# Patient Record
Sex: Female | Born: 1970 | Race: White | Hispanic: No | Smoking: Never smoker
Health system: Southern US, Community
[De-identification: ages and names within clinical notes are randomized; demographics above are authoritative.]

## PROBLEM LIST (undated history)

## (undated) DIAGNOSIS — M25552 Pain in left hip: Secondary | ICD-10-CM

## (undated) DIAGNOSIS — E781 Pure hyperglyceridemia: Secondary | ICD-10-CM

## (undated) DIAGNOSIS — L409 Psoriasis, unspecified: Secondary | ICD-10-CM

## (undated) HISTORY — DX: Pure hyperglyceridemia: E78.1

## (undated) HISTORY — DX: Pain in left hip: M25.552

## (undated) HISTORY — DX: Psoriasis, unspecified: L40.9

---

## 2000-03-24 ENCOUNTER — Other Ambulatory Visit: Admission: RE | Admit: 2000-03-24 | Discharge: 2000-03-24 | Payer: Self-pay | Admitting: Gynecology

## 2001-08-11 ENCOUNTER — Other Ambulatory Visit: Admission: RE | Admit: 2001-08-11 | Discharge: 2001-08-11 | Payer: Self-pay | Admitting: Gynecology

## 2003-04-19 ENCOUNTER — Ambulatory Visit (HOSPITAL_COMMUNITY): Admission: RE | Admit: 2003-04-19 | Discharge: 2003-04-19 | Payer: Self-pay | Admitting: Obstetrics & Gynecology

## 2003-04-27 ENCOUNTER — Inpatient Hospital Stay (HOSPITAL_COMMUNITY): Admission: AD | Admit: 2003-04-27 | Discharge: 2003-04-29 | Payer: Self-pay | Admitting: Obstetrics

## 2004-01-31 ENCOUNTER — Other Ambulatory Visit: Admission: RE | Admit: 2004-01-31 | Discharge: 2004-01-31 | Payer: Self-pay | Admitting: Internal Medicine

## 2004-01-31 ENCOUNTER — Ambulatory Visit: Payer: Self-pay | Admitting: Internal Medicine

## 2004-02-05 ENCOUNTER — Ambulatory Visit (HOSPITAL_COMMUNITY): Admission: RE | Admit: 2004-02-05 | Discharge: 2004-02-05 | Payer: Self-pay | Admitting: Internal Medicine

## 2004-08-26 ENCOUNTER — Ambulatory Visit: Payer: Self-pay | Admitting: Internal Medicine

## 2004-08-28 ENCOUNTER — Ambulatory Visit: Payer: Self-pay | Admitting: Cardiology

## 2004-09-05 ENCOUNTER — Ambulatory Visit (HOSPITAL_COMMUNITY): Admission: RE | Admit: 2004-09-05 | Discharge: 2004-09-05 | Payer: Self-pay | Admitting: Internal Medicine

## 2004-09-12 ENCOUNTER — Ambulatory Visit: Payer: Self-pay | Admitting: Internal Medicine

## 2004-09-25 ENCOUNTER — Other Ambulatory Visit: Admission: RE | Admit: 2004-09-25 | Discharge: 2004-09-25 | Payer: Self-pay | Admitting: Obstetrics and Gynecology

## 2005-02-16 HISTORY — PX: CHOLECYSTECTOMY, LAPAROSCOPIC: SHX56

## 2005-03-12 ENCOUNTER — Emergency Department (HOSPITAL_COMMUNITY): Admission: EM | Admit: 2005-03-12 | Discharge: 2005-03-12 | Payer: Self-pay | Admitting: Emergency Medicine

## 2005-03-20 ENCOUNTER — Encounter (INDEPENDENT_AMBULATORY_CARE_PROVIDER_SITE_OTHER): Payer: Self-pay | Admitting: *Deleted

## 2005-03-20 ENCOUNTER — Ambulatory Visit (HOSPITAL_COMMUNITY): Admission: RE | Admit: 2005-03-20 | Discharge: 2005-03-21 | Payer: Self-pay | Admitting: General Surgery

## 2005-10-10 ENCOUNTER — Emergency Department (HOSPITAL_COMMUNITY): Admission: EM | Admit: 2005-10-10 | Discharge: 2005-10-10 | Payer: Self-pay | Admitting: *Deleted

## 2005-10-29 ENCOUNTER — Other Ambulatory Visit: Admission: RE | Admit: 2005-10-29 | Discharge: 2005-10-29 | Payer: Self-pay | Admitting: Family Medicine

## 2006-12-09 ENCOUNTER — Other Ambulatory Visit: Admission: RE | Admit: 2006-12-09 | Discharge: 2006-12-09 | Payer: Self-pay | Admitting: Family Medicine

## 2008-09-27 ENCOUNTER — Other Ambulatory Visit: Admission: RE | Admit: 2008-09-27 | Discharge: 2008-09-27 | Payer: Self-pay | Admitting: Family Medicine

## 2010-03-09 ENCOUNTER — Encounter: Payer: Self-pay | Admitting: Internal Medicine

## 2010-03-09 ENCOUNTER — Encounter: Payer: Self-pay | Admitting: Orthopedic Surgery

## 2010-07-04 NOTE — Op Note (Signed)
Brandy Elliott, Brandy Elliott                 ACCOUNT NO.:  [id number]   MEDICAL RECORD NO.:  [id number]          PATIENT TYPE:  AMB   LOCATION:  DAY                          FACILITY:  [facility]   PHYSICIAN:  Timothy E. Earlene Plater, M.D. DATE OF BIRTH:  [birth year]   DATE OF PROCEDURE:  03/20/2005  DATE OF DISCHARGE:                                 OPERATIVE REPORT   PREOPERATIVE DIAGNOSIS:  Cholecystolithiasis.   POSTOPERATIVE DIAGNOSIS:  Cholecystolithiasis.   PROCEDURE:  Laparoscopic cholecystectomy with intraoperative cholangiogram.   SURGEON:  Timothy E. Earlene Plater, M.D.   ASSISTANT:  Wilmon Arms. Tsuei, M.D.   ANESTHESIA:  General, supervised Bruce J. Council Mechanic, M.D.   This 40 year old female otherwise healthy has known gallstones and  symptomatic cholecystolithiasis and she has electively picked this time for  surgery. She is seen, identified and the permit signed today.   The patient was taken to the operating room, placed supine, general  endotracheal anesthesia administered. The abdomen was prepped and draped in  the usual fashion. A 0.25% Marcaine with epinephrine was used prior to each  skin incision. A horizontal infraumbilical incision made, the fascia  identified, opened vertically, the abdomen entered without complications.  Abdomen insufflated, general peritoneoscopy including the pelvis and the  cecum were unremarkable. The gallbladder did contain some filmy adhesions. A  second 10 mm trocar was placed in the mid epigastrium, two 5 mm trocars in  the right upper quadrant. The gallbladder was grasped, the fatty omental  adhesions taken down bluntly and sharply and the entire gallbladder then  visualized and inspected. At the base of the gallbladder, dissection was  begun revealing a single cystic duct and a single cystic artery. These were  carefully dissected out, a clip was placed on the gallbladder side of the  cystic duct, a small incision made, catheter placed percutaneously and  into  the cystic duct remnant. We had failure of the x-ray equipment and so we  returned to our dissection, dissected out the cystic artery, clearly  identified, triply clipped and divided. Posterior to that, a small posterior  artery was doubly clipped and divided. By then the x-ray equipment was  ready. We then proceeded with an operative cholangiogram showing rapid  filling of the biliary tree and spillage into the duodenum. Though the  entire biliary system was larger than you usually see, we saw no defects or  abnormalities and no stones. With this, the clip and catheter were removed,  the stump of the cystic duct triply clipped and cystic duct fully divided.  The artery fully divided and the gallbladder then dissected from the  gallbladder bed without incident or complication. Copious irrigation carried  out. It was clear, there was no bleeding or  complications and the bed was dry. Gallbladder removed through the  infraumbilical incision. That incision closed  with #1 Vicryl. Further inspection carried out and all of the irrigant, CO2,  instruments and trocars removed under direct vision. Each wound then  inspected and the wounds closed with 3-0 Monocryl. Steri-Strips applied,  final counts correct. She tolerated it well and was awakened  and taken to  the recovery room in good condition.      Timothy E. Earlene Plater, M.D.  Electronically Signed     TED/MEDQ  D:  03/20/2005  T:  03/20/2005  Job:  [id number]   cc:   Corwin Levins, M.D. [facility]  [address]  [place]  [place] [address]

## 2011-04-02 ENCOUNTER — Ambulatory Visit (INDEPENDENT_AMBULATORY_CARE_PROVIDER_SITE_OTHER): Payer: BC Managed Care – PPO | Admitting: Family Medicine

## 2011-04-02 ENCOUNTER — Encounter: Payer: Self-pay | Admitting: Family Medicine

## 2011-04-02 VITALS — BP 108/72 | HR 68 | Ht 67.75 in | Wt 197.0 lb

## 2011-04-02 DIAGNOSIS — F411 Generalized anxiety disorder: Secondary | ICD-10-CM

## 2011-04-02 MED ORDER — ALPRAZOLAM 0.25 MG PO TABS: 0.2500 mg | ORAL_TABLET | Freq: Three times a day (TID) | ORAL | Status: AC | PRN

## 2011-04-02 NOTE — Patient Instructions (Signed)
Return to discuss daily medications if your anxiety is worsening, or needing the xanax frequently.

## 2011-04-02 NOTE — Progress Notes (Signed)
Chief complaint:  anxiety symptoms on and off for a year, only on weekends. Works during week and benefits from the structure but on weekends has so much to do it becomes overwhelming  HPI:   Attacks before noon on weekends, described as though something is grabbing her throat, feels hot, heart racing overwhelmed, irritable, angry, snapping.  Symptoms were the worst this past weekend, where it was clearer to her that it was a panic attack. Took a friend's xanax the other day, which helped  Having trouble coping on the weekends on and off for over a year.   Divorced in 2008; in 2011 he moved out of the country and he stopped paying child support.  Had to file for bankruptcy. Got a new job in January, and financial stability may come soon, but still struggling financially right now.  She has history of significant fatigue, which peaked in October.  Was exhausted, fuzzy-minded, emotional.  Previous MD wanted to put her on antidepressants, she declined. She felt mentally strong, just that her body was dragging her down. Had labs were checked and were "normal".  She went to [facility] (does bioactive hormones).  Had additional thyroid studies done, cortisol tests.  She was prescribed prenenalone, progesterone, Armour thyroid.  She took these for 3 months, but didn't go back for repeat testing due to finances. She never really took the progesterone, but took the other 2 meds.  Didn't noticed much of a difference.  Got some "crescents" on the tongue after stopping the Armour thyroid (has also had in the past).  Fatigue overall is a little better, but fluctuates  She recently started running, hoping that will help with her stress, weight.  She has a history of pyriformis syndrome, taking tramadol as needed.  Massage helps, but can't afford it now.  Also finds that the tramadol gives her some energy, so she continues to take it for both pain and energy  History reviewed. No pertinent past medical  history.  Past Surgical History  Procedure Date  . Cholecystectomy, laparoscopic 2007    History   Social History  . Marital Status: Legally Separated    Spouse Name: N/A    Number of Children: 2  . Years of Education: N/A   Occupational History  . Engineering geologist   Social History Main Topics  . Smoking status: Never Smoker   . Smokeless tobacco: Never Used  . Alcohol Use: Yes     4 glasses of wine per month.  . Drug Use: No  . Sexually Active: Not Currently    Birth Control/ Protection: IUD     Copper IUD   Other Topics Concern  . Not on file   Social History Narrative   Lives with 2 daughters, dog and cat    Family History  Problem Relation Age of Onset  . Cancer Neg Hx     Current outpatient prescriptions:ibuprofen (ADVIL,MOTRIN) 200 MG tablet, Take 200 mg by mouth as needed., Disp: , Rfl: ;  Pregnenolone Micronized POWD, 1 capsule by Does not apply route daily. 25mg , Disp: , Rfl: ;  traMADol (ULTRAM) 50 MG tablet, Take 50 mg by mouth 3 (three) times daily., Disp: , Rfl: ;  ALPRAZolam (XANAX) 0.25 MG tablet, Take 1 tablet (0.25 mg total) by mouth 3 (three) times daily as needed for anxiety., Disp: 30 tablet, Rfl: 0  No Known Allergies  ROS:  Denies fever, weight changes, temperature intolerance, URI symptoms, chest pain, GI complaints, GU complaints, skin  rash. Denies depression, insomnia.  +hip pain/pyriformis syndrome, no other joint pains or other concerns  PHYSICAL EXAM: BP 108/72  Pulse 68  Ht 5' 7.75" (1.721 m)  Wt 197 lb (89.359 kg)  BMI 30.18 kg/m2  LMP 03/04/2011 Well developed, pleasant female in no distress HEENT:  PERRL, EOMI, conjunctiva clear,  OP clear Neck: no lymphadenopathy, thyromegaly or bruit Heart: regular rate and rhythm without murmur Lungs: clear bilaterally Abdomen: soft, nontender, no organomegaly or mass Extremities: no edema, 2+ pulse Skin: no rash Neuro: alert and oriented, normal gait, strength Psych: normal mood,  full range of affect. Normal hygiene, grooming, speech, eye contact  ASSESSMENT/PLAN: 1. Anxiety state, unspecified  ALPRAZolam (XANAX) 0.25 MG tablet   Feels strongly about not taking a daily medication.  Discussed stress reduction techniques, exercise, counseling.  To consider daily meds if increasing frequency/severity of anxiety.  Reviewed risks/side effects of xanax  F/u prn Will be signing release to get records from previous MD transferred here

## 2011-04-30 ENCOUNTER — Encounter: Payer: Self-pay | Admitting: *Deleted

## 2011-05-26 ENCOUNTER — Other Ambulatory Visit: Payer: Self-pay | Admitting: Family Medicine

## 2011-05-26 NOTE — Telephone Encounter (Signed)
Please pull chart for me to see if old records ever came (this was never rx'd by me, rx'd by her previous MD).  Pt seen in Feb, and records requested.  Please put chart on my desk and I'll address tomorrow.  Thanks.  The quantity and refills is NOT something I will agree to... (order came as #100 with 5 refills)

## 2011-05-26 NOTE — Telephone Encounter (Signed)
Waiting for Dr.Knapp

## 2011-05-26 NOTE — Telephone Encounter (Signed)
Chart is on desk 

## 2011-05-26 NOTE — Telephone Encounter (Signed)
Is this ok?

## 2011-05-27 NOTE — Telephone Encounter (Signed)
Chart reviewed--minimal info.  Shouldn't need longterm ultram use for pyriformis syndrome.  Needs OV if using frequently

## 2011-06-29 ENCOUNTER — Telehealth: Payer: Self-pay | Admitting: *Deleted

## 2011-06-29 ENCOUNTER — Other Ambulatory Visit: Payer: Self-pay | Admitting: Family Medicine

## 2011-06-29 MED ORDER — TRAMADOL HCL 50 MG PO TABS: 50.0000 mg | ORAL_TABLET | Freq: Four times a day (QID) | ORAL | Status: DC | PRN

## 2011-06-29 NOTE — Telephone Encounter (Signed)
Spoke with Dr.Knapp and she agreed to give patient #10 Ultram. Patient is going to call tomorrow once she checks her schedule and get an appt with Dr.Knapp in the next 10 days.

## 2011-06-29 NOTE — Telephone Encounter (Signed)
Is this okay to refill? 

## 2011-06-29 NOTE — Telephone Encounter (Signed)
Needs OV. Went through #30 in the last month (ie not just using infrequently at this point)

## 2011-07-01 ENCOUNTER — Encounter: Payer: Self-pay | Admitting: Family Medicine

## 2011-07-01 ENCOUNTER — Ambulatory Visit (INDEPENDENT_AMBULATORY_CARE_PROVIDER_SITE_OTHER): Payer: BC Managed Care – PPO | Admitting: Family Medicine

## 2011-07-01 VITALS — BP 110/72 | HR 80 | Ht 67.75 in | Wt 195.0 lb

## 2011-07-01 DIAGNOSIS — E559 Vitamin D deficiency, unspecified: Secondary | ICD-10-CM

## 2011-07-01 DIAGNOSIS — M545 Low back pain: Secondary | ICD-10-CM

## 2011-07-01 DIAGNOSIS — R5383 Other fatigue: Secondary | ICD-10-CM

## 2011-07-01 DIAGNOSIS — R5381 Other malaise: Secondary | ICD-10-CM

## 2011-07-01 MED ORDER — MELOXICAM 15 MG PO TABS: 15.0000 mg | ORAL_TABLET | Freq: Every day | ORAL | Status: AC

## 2011-07-01 NOTE — Patient Instructions (Signed)
Continue to wean off the tramadol, and only use as needed for severe pain. Stretching and yoga daily will help with your pain. Chiropractic care will help with your hip pain--consider intermittent care.  Stop the ibuprofen.  Instead take meloxicam once daily with food.  Try and take it every day for at least 10-14 days for it to have its full anti-inflammatory effect.    Vitamin D--try and take a vitamin (OTC) of at least 1000 IU every day.

## 2011-07-01 NOTE — Progress Notes (Signed)
Chief Complaint  Patient presents with  . Advice Only    discuss Ultram with Dr.Ibn Stief.   HPI:  At her initial visit, in 03/2011, we discussed her having a h/o pyriformis syndrome, using tramadol as needed.  She had reported that massage helped, but couldn't afford it.  She also had reported that the tramadol "gave her energy", and that she continued to take it for both pain and energy.  After that visit it became apparent to me that she takes the medication every day.  Upon her last refill request, she was asked to make appointment to discuss this medication in more detail, which is why she is here today. She reports that she does in fact use tramadol "as needed", but has chronic pain, and was needing it twice daily.  She recently was able to cut back to 1/2 tablet twice daily, and has needed to increase her use of ibuprofen since doing so. Using 400mg  of ibuprofen 2-3 times/day.  Doesn't bother stomach. She is very hesitant to take NSAIDs regularly, prefers prn.  She was seen in February with complaints of feeling overwhelmed, anxious and irritable on the weekends. She was given rx for xanax to use prn. --has only needed to take 1 xanax since then.  She was feeling very tired then, and described have had two "low points"--at last visit with me, and in 07/2010 when she saw Dr. Paulino Rily.  She reports that after her last visit here, her energy got better, but had recurrence of decreased energy a few weeks ago.  Gradually declining in energy over the last week.  Energy was a little better last night--she was able to play soccer, and tennis. She reports being in chronic pain in her hips.  Can't afford chiropractor or massages (which have helped in past). Tries to do some stretches in bed. Does some home exercises from neighbor who is PT.  Told that her muscles were weak. Admits to not doing exercises regularly.  She had seen [place] [facility] several years ago, who originally rx'd ultram.  MRI was done which was  reportedly normal (not in system for my review).  She was frustrated that it was normal, given cost of test.  Records from Dr. Paulino Rily were received--h/o vitamin D deficiency was noted in 09/2008.  Last visit with Dr. Paulino Rily was 07/2010 with fatigue, tearful, depression.  Hg, iron, TSH and cortisol levels were normal.  Vitamin D was low at 19.5, B12 243. Sed rate and CRP were normal.  Med check for tramadol 06/2009--LBP, recurrent, due to sacroileiitis--advised of potential habit-forming properties, and refilled #100 with 3 refills  Admits to taking Vitamin D only irregularly.  Past Medical History  Diagnosis Date  . Psoriasis   . Arthralgia of hip, left     and other joints, probably secondary to psoriasis, chronic  . High triglycerides   . Vitamin d deficiency 09/2008    Past Surgical History  Procedure Date  . Cholecystectomy, laparoscopic 2007    History   Social History  . Marital Status: Legally Separated    Spouse Name: N/A    Number of Children: 2  . Years of Education: N/A   Occupational History  . Engineering geologist   Social History Main Topics  . Smoking status: Never Smoker   . Smokeless tobacco: Never Used  . Alcohol Use: Yes     4 glasses of wine per month.  . Drug Use: No  . Sexually Active: Not Currently    Birth  Control/ Protection: IUD     Copper IUD   Other Topics Concern  . Not on file   Social History Narrative   Lives with 2 daughters, dog and cat    Family History  Problem Relation Age of Onset  . Cancer Neg Hx     Current outpatient prescriptions:ALPRAZolam (XANAX) 0.25 MG tablet, Take 1 tablet (0.25 mg total) by mouth 3 (three) times daily as needed for anxiety., Disp: 30 tablet, Rfl: 0;  ibuprofen (ADVIL,MOTRIN) 200 MG tablet, Take 200 mg by mouth as needed., Disp: , Rfl: ;  traMADol (ULTRAM) 50 MG tablet, Take 1 tablet (50 mg total) by mouth every 6 (six) hours as needed for pain., Disp: 10 tablet, Rfl: 0 meloxicam (MOBIC) 15 MG tablet,  Take 1 tablet (15 mg total) by mouth daily., Disp: 30 tablet, Rfl: 1  No Known Allergies  ROS:  Sometimes throat gets sore. Sometimes feels a squishy sensation on the right side of her neck.  Denies allergies, congestion, shortness of breath, fevers. See HPI.  Denies skin rash, GI complaints, GU complaints  PHYSICAL EXAM: BP 110/72  Pulse 80  Ht 5' 7.75" (1.721 m)  Wt 195 lb (88.451 kg)  BMI 29.87 kg/m2  LMP 06/30/2011 Well developed, pleasant, overweight female, in no distress Appears to be frustrated regarding her constant pain, and inability to afford the treatments that would make her better.  With her frustration, she seemed irritated, and snappy, but later was more friendly Back: no spinal or CVA tenderness.  Area of discomfort is at [facility] joint, but nontender on exam.  nontender at sciatic notches and at deep muscles in buttocks.  Very significantly limited ROM of pyriformis/ability to externally rotate hip and cross her R ankle over L knee and vice versa.  DTR's are 2+, normal strength, sensation.  ASSESSMENT/PLAN: 1. Lumbago  meloxicam (MOBIC) 15 MG tablet  2. Unspecified vitamin D deficiency    3. Other malaise and fatigue     History is consistent with SI joint dysfunction and pyriformis syndrome.  Tight pyriformis muscles.  Exercises/stretches shown. Encouraged f/u with chiro if/when she can afford, and discussed the importance of daily stretching. Recommended trial of yoga each evening.  Spent a lot of time discussing treatment of underlying condition, vs masking pain, which is all that the daily tramadol use does.  She is willing to try NSAID course.  NSAID precautions were reviewed, importance of using it daily for 10-14 days regularly reviewed.  Risks/side effects reviewed, to stop ibuprofen.  She reportedly will try and further wean off tramadol.  Potential risks of tramadol, including dependence, reviewed.  Vitamin D deficiency--never really treated, documented twice in old  records. Rather than checking labs again, she was encouraged to try and take at least 1000 IU of daily OTC Vitamin D on a regular basis.  Recheck at some point in the future. Risks of vitamin D deficiency were reviewed with patient--importance of getting calcium and vitamin D to protect her bones was discussed, in addition to the fact that vitamin D deficiency can contribute to her complaints of fatigue.  We also discussed the fact that it sounds as though there has been a component of anxiety (feeling overwhelmed), as well as possibly depression in the past.  Discussed that chronic pain can contribute to risk for depression.  Discussed that she should consider antidepressants (especially Cymbalta, given her chronic pain) if ongoing mood issues and pain in the future, not interested now.  All her questions were  answered.  Total visit time was 40 minutes, more than 1/2 spent in counseling

## 2011-09-08 ENCOUNTER — Other Ambulatory Visit: Payer: Self-pay | Admitting: Family Medicine

## 2011-09-08 DIAGNOSIS — Z1231 Encounter for screening mammogram for malignant neoplasm of breast: Secondary | ICD-10-CM

## 2011-09-29 ENCOUNTER — Ambulatory Visit: Payer: BC Managed Care – PPO

## 2011-10-21 ENCOUNTER — Ambulatory Visit: Payer: BC Managed Care – PPO

## 2011-11-02 ENCOUNTER — Telehealth: Payer: Self-pay | Admitting: Internal Medicine

## 2011-11-02 NOTE — Telephone Encounter (Signed)
Nope, we didn't discuss (I don't believe)--is better for depression than anxiety.  I'd like OV to discuss meds, side effects, risks, etc.

## 2011-11-02 NOTE — Telephone Encounter (Signed)
Pt would like to go on an antidepressant. Pt would like to know if you would prescribe something to her and then she follow-up later on. [facility] on [address]

## 2011-11-02 NOTE — Telephone Encounter (Signed)
Pt states she is scared about gaining weight and she has family members on wellbutrin and wants to know if she can try that? She feels like she discussed this at last visit but isnt certain

## 2011-11-02 NOTE — Telephone Encounter (Signed)
No--needs OV to discuss medications (many to choose from) and side effects.  We have previously discussed Cymbalta.  If she is willing to try that one (since it might also help with pain), then can give 1 week of 30mg  sample and call in rx for 60mg .  Recall that this is a brand medication, so more expensive than some of the others. We have discussed anxiety more than depression in the past, so I'd really prefer to see her in office prior to starting meds, to make sure that we are prescribing the appropriate medication. If she cannot come in, and just wants to start Cymbalta (recognizing increased cost), then make f/u appt for 4-5 weeks after starting med.

## 2011-11-03 NOTE — Telephone Encounter (Signed)
Pt can not make appt right now due to her mom being sick and her having to pick her kids up from school and having to leave work everyday early.

## 2011-11-06 ENCOUNTER — Ambulatory Visit
Admission: RE | Admit: 2011-11-06 | Discharge: 2011-11-06 | Disposition: A | Discharge status: Home / Self Care | Payer: BC Managed Care – PPO | Source: Ambulatory Visit | Attending: Family Medicine | Admitting: Family Medicine

## 2011-11-06 DIAGNOSIS — Z1231 Encounter for screening mammogram for malignant neoplasm of breast: Secondary | ICD-10-CM

## 2012-10-20 ENCOUNTER — Other Ambulatory Visit: Payer: Self-pay | Admitting: Family Medicine

## 2012-10-20 DIAGNOSIS — Z1231 Encounter for screening mammogram for malignant neoplasm of breast: Secondary | ICD-10-CM

## 2012-12-05 ENCOUNTER — Ambulatory Visit: Payer: BC Managed Care – PPO

## 2013-10-17 ENCOUNTER — Other Ambulatory Visit: Payer: Self-pay

## 2013-10-17 DIAGNOSIS — Z1231 Encounter for screening mammogram for malignant neoplasm of breast: Secondary | ICD-10-CM

## 2013-10-18 ENCOUNTER — Encounter (INDEPENDENT_AMBULATORY_CARE_PROVIDER_SITE_OTHER): Payer: Self-pay

## 2013-10-18 ENCOUNTER — Ambulatory Visit
Admission: RE | Admit: 2013-10-18 | Discharge: 2013-10-18 | Disposition: A | Discharge status: Home / Self Care | Payer: BC Managed Care – PPO | Source: Ambulatory Visit

## 2013-10-18 DIAGNOSIS — Z1231 Encounter for screening mammogram for malignant neoplasm of breast: Secondary | ICD-10-CM

## 2015-04-22 ENCOUNTER — Other Ambulatory Visit: Payer: Self-pay

## 2015-04-22 DIAGNOSIS — Z1231 Encounter for screening mammogram for malignant neoplasm of breast: Secondary | ICD-10-CM

## 2015-05-07 ENCOUNTER — Ambulatory Visit
Admission: RE | Admit: 2015-05-07 | Discharge: 2015-05-07 | Disposition: A | Discharge status: Home / Self Care | Payer: BC Managed Care – PPO | Source: Ambulatory Visit

## 2015-05-07 DIAGNOSIS — Z1231 Encounter for screening mammogram for malignant neoplasm of breast: Secondary | ICD-10-CM

## 2015-10-28 ENCOUNTER — Ambulatory Visit
Admission: RE | Admit: 2015-10-28 | Discharge: 2015-10-28 | Disposition: A | Discharge status: Home / Self Care | Payer: BC Managed Care – PPO | Source: Ambulatory Visit | Attending: Physical Medicine & Rehabilitation | Admitting: Physical Medicine & Rehabilitation

## 2015-10-28 ENCOUNTER — Encounter: Payer: Self-pay | Admitting: Physical Medicine & Rehabilitation

## 2015-10-28 ENCOUNTER — Encounter: Payer: BC Managed Care – PPO | Attending: Physical Medicine & Rehabilitation

## 2015-10-28 ENCOUNTER — Ambulatory Visit (HOSPITAL_BASED_OUTPATIENT_CLINIC_OR_DEPARTMENT_OTHER): Payer: BC Managed Care – PPO | Admitting: Physical Medicine & Rehabilitation

## 2015-10-28 VITALS — BP 107/72 | HR 73 | Resp 14

## 2015-10-28 DIAGNOSIS — G8929 Other chronic pain: Secondary | ICD-10-CM

## 2015-10-28 DIAGNOSIS — E559 Vitamin D deficiency, unspecified: Secondary | ICD-10-CM | POA: Diagnosis not present

## 2015-10-28 DIAGNOSIS — M25552 Pain in left hip: Secondary | ICD-10-CM | POA: Insufficient documentation

## 2015-10-28 DIAGNOSIS — M24559 Contracture, unspecified hip: Secondary | ICD-10-CM

## 2015-10-28 DIAGNOSIS — E781 Pure hyperglyceridemia: Secondary | ICD-10-CM | POA: Insufficient documentation

## 2015-10-28 DIAGNOSIS — M533 Sacrococcygeal disorders, not elsewhere classified: Secondary | ICD-10-CM

## 2015-10-28 DIAGNOSIS — M545 Low back pain: Secondary | ICD-10-CM

## 2015-10-28 DIAGNOSIS — L409 Psoriasis, unspecified: Secondary | ICD-10-CM | POA: Diagnosis not present

## 2015-10-28 MED ORDER — METHOCARBAMOL 500 MG PO TABS
500.0000 mg | ORAL_TABLET | Freq: Every day | ORAL | 1 refills | Status: AC
Start: 2015-10-28 — End: ?

## 2015-10-28 NOTE — Progress Notes (Addendum)
Subjective:    Patient ID: Brandy Elliott, female    DOB: 31-Mar-1970, 45 y.o.   MRN: [id number]  HPI CC:  Left hip and back pain  Since 2008, no obvious injury, last pregnancy in 2005, no back pain issues at that time.  Seen by Chiropracter, Integrative therapy,  +RF but not diagnosed with RA.   PT with manual therapy helped somewhat, some massage therapy.   Has had some financial issues limiting some follow through with therapy. No leg length discrepency No hx of Sacro- iliac injection  Pain interferes with sleep Walking or standing worse, elliptical Training is well tolerated. First MRI 2009, Report not available Some hip films reportedly done at a chiropractic office, although the patient thought it may have been just a pelvic film.  Patient would like to get a better idea what is causing her pain, as well as what type of activity. She should avoid.  She does work full time in a sedentary position, single mom. Social stressors are mainly financial  Pain Inventory Average Pain 6 Pain Right Now 3 My pain is constant, sharp, burning, dull, stabbing, tingling and aching  In the last 24 hours, has pain interfered with the following? General activity 9 Relation with others 7 Enjoyment of life 9 What TIME of day is your pain at its worst? morning, night Sleep (in general) Poor  Pain is worse with: walking, standing and some activites Pain improves with: heat/ice, therapy/exercise and medication Relief from Meds: 5  Mobility walk without assistance how many minutes can you walk? 15 ability to climb steps?  yes do you drive?  yes Do you have any goals in this area?  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  no  Neuro/Psych depression anxiety  Prior Studies new visit  Physicians involved in your care new visit   Family History  Problem Relation Age of Onset  . Cancer Neg Hx    Social  History   Social History  . Marital status: Legally Separated    Spouse name: N/A  . Number of children: 2  . Years of education: N/A   Occupational History  . Engineering geologist   Social History Main Topics  . Smoking status: Never Smoker  . Smokeless tobacco: Never Used  . Alcohol use Yes     Comment: 4 glasses of wine per month.  . Drug use: No  . Sexual activity: Not Currently    Birth control/ protection: IUD     Comment: Copper IUD   Other Topics Concern  . None   Social History Narrative   Lives with 2 daughters, dog and cat   Past Surgical History:  Procedure Laterality Date  . CHOLECYSTECTOMY, LAPAROSCOPIC  2007   Past Medical History:  Diagnosis Date  . Arthralgia of hip, left    and other joints, probably secondary to psoriasis, chronic  . High triglycerides   . Psoriasis   . Vitamin D deficiency 09/2008   BP 107/72 (BP Location: Left Arm, Patient Position: Sitting, Cuff Size: Large)   Pulse 73   Resp 14   SpO2 97%   Opioid Risk Score:   Fall Risk Score:  `1  Depression screen PHQ 2/9  Depression screen PHQ 2/9 10/28/2015  Decreased Interest 1  Down, Depressed, Hopeless 1  PHQ - 2 Score 2  Altered sleeping 1  Tired, decreased energy 3  Change in  appetite 1  Feeling bad or failure about yourself  2  Trouble concentrating 2  Moving slowly or fidgety/restless 0  Suicidal thoughts 0  PHQ-9 Score 11  Difficult doing work/chores Very difficult    Review of Systems  Constitutional: Positive for unexpected weight change.  HENT: Negative.   Eyes: Negative.   Respiratory: Negative.   Cardiovascular: Negative.   Gastrointestinal: Negative.   Endocrine: Negative.   Genitourinary: Negative.   Musculoskeletal: Positive for back pain.  Allergic/Immunologic: Negative.   Neurological: Negative.   Hematological: Negative.   Psychiatric/Behavioral: Positive for dysphoric mood. The patient is nervous/anxious.   All other systems reviewed and are  negative.      Objective:   Physical Exam  Constitutional: She is oriented to person, place, and time. She appears well-developed and well-nourished.  HENT:  Head: Normocephalic and atraumatic.  Eyes: Conjunctivae and EOM are normal. Pupils are equal, round, and reactive to light.  Neck: Normal range of motion.  Cardiovascular: Normal rate, regular rhythm and normal heart sounds.   Pulmonary/Chest: Effort normal and breath sounds normal.  Abdominal: Soft. Bowel sounds are normal. She exhibits no distension. There is no tenderness.  Musculoskeletal:       Right hip: She exhibits decreased range of motion. She exhibits normal strength, no tenderness and no deformity.       Left hip: She exhibits decreased range of motion. She exhibits normal strength, no tenderness and no deformity.       Right knee: Normal.       Left knee: Normal.       Right ankle: Normal.       Left ankle: Normal.       Cervical back: Normal.       Thoracic back: Normal.       Lumbar back: She exhibits decreased range of motion and tenderness. She exhibits no deformity and no spasm.  Patient has good lumbar range of motion with forward flexion but has limited extension to approximately 50% as well as lateral bending and rotation. He has lower back pain during these maneuvers.  Neurological: She is alert and oriented to person, place, and time.  Psychiatric: She has a normal mood and affect.  Nursing note and vitals reviewed.   0, external rotation at bilateral hips Normal internal rotation. Hip External rotation is accompanied by pain. Femoral stretch test is negative bilaterally. Sensation normal. Bilateral C5, C6-C7, C8, T1, L2, L3, L4, L5-S1 dermatomal distribution  Patient has     Assessment & Plan:  1. Chronic low back and hip pain. The main clinical finding is extremely limited hip external rotation, as well as some limited lumbar extension due to pain. Impression is that her main pathology is likely  at the hip joints. She may have some labral pathology or femoral acetabular impingement, given her range of motion limitation, mainly external rotation would favor labral pathology over FAI. Given her age, Osteoarthritis is less likely. She likely has some accompanying sacroiliac pain as well as some myofascial pain related to hip mobility issues and compensatory movements. Will check hip x-rays, if these are negative, may consider hip MRI and referral to orthopedics We'll start Robaxin 500 mg 1-2 daily at bedtime she has done well with muscle relaxants in the past to at least help her sleep  Do not think any further PT needed at this time given her recent PT trial  Do not anticipate use of narcotic analgesics in this patient, no UDS sent  Return  to clinic in 2 weeks

## 2015-11-01 ENCOUNTER — Telehealth: Payer: Self-pay | Admitting: Physical Medicine & Rehabilitation

## 2015-11-01 DIAGNOSIS — M25559 Pain in unspecified hip: Principal | ICD-10-CM

## 2015-11-01 DIAGNOSIS — M24559 Contracture, unspecified hip: Secondary | ICD-10-CM

## 2015-11-01 DIAGNOSIS — G8929 Other chronic pain: Secondary | ICD-10-CM

## 2015-11-01 NOTE — Telephone Encounter (Signed)
May need MRI of hips since xrays normal Can discuss at next visit

## 2015-11-01 NOTE — Telephone Encounter (Signed)
Good news!  Normal!

## 2015-11-01 NOTE — Telephone Encounter (Signed)
Patient would like to know the results of Skip MayerXray's that she had done.  Please call patient.

## 2015-11-01 NOTE — Telephone Encounter (Signed)
She would like to proceed with the MRI now if possible.  Her appt is 9/25 but she wants to go ahead.

## 2015-11-05 ENCOUNTER — Telehealth: Payer: Self-pay

## 2015-11-05 NOTE — Telephone Encounter (Signed)
Pt is getting an MRI done next Tuesday for hips. She was wondering if she could also get an order to look at her SI joint and lower back. She states that her last MRI of the hips 10 years ago was normal and she "would hate to come back" if it turned out normal again. Please advise.

## 2015-11-05 NOTE — Telephone Encounter (Signed)
Left message to notify the pt.

## 2015-11-05 NOTE — Telephone Encounter (Signed)
A hip MRI will image the sacroiliac joints as well

## 2015-11-06 ENCOUNTER — Telehealth: Payer: Self-pay | Admitting: Physical Medicine & Rehabilitation

## 2015-11-06 NOTE — Telephone Encounter (Signed)
Please advise 

## 2015-11-06 NOTE — Telephone Encounter (Signed)
Left message to advise pt  

## 2015-11-06 NOTE — Telephone Encounter (Signed)
Will get MRI hip and discuss at followup

## 2015-11-06 NOTE — Telephone Encounter (Signed)
Patient is returning a call to our office.  She also is having a new issue with tingling on the left side of her body mid torso through mid thigh at night.  Please call patient.

## 2015-11-11 ENCOUNTER — Ambulatory Visit: Payer: BC Managed Care – PPO | Admitting: Physical Medicine & Rehabilitation

## 2015-11-12 ENCOUNTER — Inpatient Hospital Stay: Admission: RE | Admit: 2015-11-12 | Payer: BC Managed Care – PPO | Source: Ambulatory Visit

## 2015-11-12 ENCOUNTER — Other Ambulatory Visit: Payer: BC Managed Care – PPO

## 2015-11-14 ENCOUNTER — Ambulatory Visit: Payer: BC Managed Care – PPO | Admitting: Physical Medicine & Rehabilitation

## 2015-11-14 ENCOUNTER — Ambulatory Visit: Payer: BC Managed Care – PPO

## 2015-11-20 ENCOUNTER — Ambulatory Visit
Admission: RE | Admit: 2015-11-20 | Discharge: 2015-11-20 | Disposition: A | Discharge status: Home / Self Care | Payer: BC Managed Care – PPO | Source: Ambulatory Visit | Attending: Physical Medicine & Rehabilitation | Admitting: Physical Medicine & Rehabilitation

## 2015-11-20 DIAGNOSIS — G8929 Other chronic pain: Secondary | ICD-10-CM

## 2015-11-20 DIAGNOSIS — M24559 Contracture, unspecified hip: Secondary | ICD-10-CM

## 2015-11-20 DIAGNOSIS — M25559 Pain in unspecified hip: Principal | ICD-10-CM

## 2015-11-20 MED ORDER — GADOBENATE DIMEGLUMINE 529 MG/ML IV SOLN
18.0000 mL | Freq: Once | INTRAVENOUS | Status: AC | PRN
  Administered 2015-11-20: 18 mL via INTRAVENOUS

## 2015-11-21 ENCOUNTER — Ambulatory Visit (HOSPITAL_BASED_OUTPATIENT_CLINIC_OR_DEPARTMENT_OTHER): Payer: BC Managed Care – PPO | Admitting: Physical Medicine & Rehabilitation

## 2015-11-21 ENCOUNTER — Encounter: Payer: BC Managed Care – PPO | Attending: Physical Medicine & Rehabilitation

## 2015-11-21 ENCOUNTER — Encounter: Payer: Self-pay | Admitting: Physical Medicine & Rehabilitation

## 2015-11-21 VITALS — BP 122/84 | HR 72 | Resp 14

## 2015-11-21 DIAGNOSIS — E559 Vitamin D deficiency, unspecified: Secondary | ICD-10-CM | POA: Diagnosis not present

## 2015-11-21 DIAGNOSIS — M545 Low back pain: Secondary | ICD-10-CM | POA: Insufficient documentation

## 2015-11-21 DIAGNOSIS — S73191D Other sprain of right hip, subsequent encounter: Secondary | ICD-10-CM | POA: Diagnosis not present

## 2015-11-21 DIAGNOSIS — M162 Bilateral osteoarthritis resulting from hip dysplasia: Secondary | ICD-10-CM

## 2015-11-21 DIAGNOSIS — E781 Pure hyperglyceridemia: Secondary | ICD-10-CM | POA: Diagnosis not present

## 2015-11-21 DIAGNOSIS — G8929 Other chronic pain: Secondary | ICD-10-CM | POA: Diagnosis not present

## 2015-11-21 DIAGNOSIS — M25552 Pain in left hip: Secondary | ICD-10-CM | POA: Diagnosis not present

## 2015-11-21 DIAGNOSIS — L409 Psoriasis, unspecified: Secondary | ICD-10-CM | POA: Diagnosis not present

## 2015-11-21 DIAGNOSIS — S73192D Other sprain of left hip, subsequent encounter: Secondary | ICD-10-CM

## 2015-11-21 DIAGNOSIS — S73191A Other sprain of right hip, initial encounter: Secondary | ICD-10-CM | POA: Insufficient documentation

## 2015-11-21 NOTE — Patient Instructions (Signed)
Referral made to Dr. Caswell CorwinStubbs at [facility]. Their office will contact you. If you wish to have a left hip injection under fluoroscopic guidance. Please call my office. I will see in 3 months for follow-up.

## 2015-11-21 NOTE — Progress Notes (Signed)
Subjective:    Patient ID: Brandy Elliott, female    DOB: 1970-07-20, 45 y.o.   MRN: [id number]  HPI  MRI bilateral hips showed labral tears.Please see report listed below. Patient continues to complain of hip and back pain. She states that while laying on the MRI table. Her back was hurting her. She has some pain that radiates from the left hip into the left lateral knee. She has goals of weight loss as well as increasing her level of activity. She states that as recently as 2 years ago she was running and doing other vigorous exercise. She has no numbness or tingling in the legs. She has no swelling in the knee. Patient states that she has clicking sounds in her hip in her back but not in her knee. She feels like the muscles around her hip are tight.   Pain Inventory Average Pain 6 Pain Right Now 4 My pain is sharp, burning, dull, stabbing, tingling and aching  In the last 24 hours, has pain interfered with the following? General activity 10 Relation with others 9 Enjoyment of life 10 What TIME of day is your pain at its worst? All Sleep (in general) Poor  Pain is worse with: walking, bending, sitting, standing and some activites Pain improves with: medication Relief from Meds: 5  Mobility walk without assistance how many minutes can you walk? 10 Do you have any goals in this area?  no  Function employed # of hrs/week 40 Do you have any goals in this area?  no  Neuro/Psych No problems in this area  Prior Studies Any changes since last visit?  no CLINICAL DATA:  Left hip and pelvic pain for more than 10 years, worsening.  EXAM: MRI OF THE LEFT HIP WITHOUT AND WITH CONTRAST  TECHNIQUE: Multiplanar, multisequence MR imaging was performed both before and after administration of intravenous contrast.  CONTRAST:  18 ml MULTIHANCE GADOBENATE DIMEGLUMINE 529 MG/ML IV SOLN  COMPARISON:  Plain films of the hips 07/28/2015.  FINDINGS: Bones: The acetabular  fossae appear shallow bilaterally compatible with mild acetabular dysplasia. Marrow signal is somewhat heterogeneous throughout most consistent with marrow reactivation as can be seen in obesity or cigarette smoking. There is no avascular necrosis of the femoral heads. No worrisome marrow lesion is identified.  Articular cartilage and labrum  Articular cartilage: Cartilage is thinned about both the right and left hips.  Labrum: There is extensive tearing of the anterior superior and superior acetabular labrum on the left. A paralabral cyst measuring 0.4 cm in diameter is seen at the 2 o'clock position of the anterior labrum. There is also a paralabral cyst off the right labrum at approximately the 1 o'clock position measuring 0.8 cm consistent with labral tear.  Joint or bursal effusion  Joint effusion: Small left hip joint effusion is noted. Postcontrast imaging demonstrates synovial enhancement about the left hip consistent with synovitis.  Bursae:  Unremarkable.  Muscles and tendons  Muscles and tendons:  Intact.  Other findings  Miscellaneous:   Imaged intrapelvic contents are unremarkable.  IMPRESSION: Advanced for age appearing bilateral hip osteoarthritis with associated labral tears. Shallow appearance of the acetabular fossa on the right and left is compatible with mild acetabular dysplasia.   Electronically Signed   By: Drusilla Kannerhomas  Dalessio M.D.   On: 11/20/2015 10:23 Physicians involved in your care Primary care Dr. Jillyn HiddenFulp   Family History  Problem Relation Age of Onset  . Cancer Neg Hx    Social History  Social History  . Marital status: Legally Separated    Spouse name: N/A  . Number of children: 2  . Years of education: N/A   Occupational History  . Engineering geologist   Social History Main Topics  . Smoking status: Never Smoker  . Smokeless tobacco: Never Used  . Alcohol use Yes     Comment: 4 glasses of wine per month.  .  Drug use: No  . Sexual activity: Not Currently    Birth control/ protection: IUD     Comment: Copper IUD   Other Topics Concern  . None   Social History Narrative   Lives with 2 daughters, dog and cat   Past Surgical History:  Procedure Laterality Date  . CHOLECYSTECTOMY, LAPAROSCOPIC  2007   Past Medical History:  Diagnosis Date  . Arthralgia of hip, left    and other joints, probably secondary to psoriasis, chronic  . High triglycerides   . Psoriasis   . Vitamin D deficiency 09/2008   BP 122/84   Pulse 72   Resp 14   SpO2 98%   Opioid Risk Score:   Fall Risk Score:  `1  Depression screen PHQ 2/9  Depression screen PHQ 2/9 10/28/2015  Decreased Interest 1  Down, Depressed, Hopeless 1  PHQ - 2 Score 2  Altered sleeping 1  Tired, decreased energy 3  Change in appetite 1  Feeling bad or failure about yourself  2  Trouble concentrating 2  Moving slowly or fidgety/restless 0  Suicidal thoughts 0  PHQ-9 Score 11  Difficult doing work/chores Very difficult      Review of Systems  All other systems reviewed and are negative.      Objective:   Physical Exam  Constitutional: She is oriented to person, place, and time. She appears well-developed and well-nourished.  HENT:  Head: Normocephalic and atraumatic.  Eyes: Conjunctivae and EOM are normal. Pupils are equal, round, and reactive to light.  Neck: Normal range of motion.  Musculoskeletal:       Right hip: She exhibits decreased range of motion and deformity. She exhibits normal strength and no tenderness.       Left hip: She exhibits decreased range of motion and deformity. She exhibits normal strength and no tenderness.       Lumbar back: She exhibits decreased range of motion and tenderness. She exhibits no bony tenderness and no deformity.  Neurological: She is alert and oriented to person, place, and time. She displays no atrophy. No sensory deficit. Gait normal.  Negative straight leg raising  test Motor strength is 5/5 bilateral hip flexor, knee extensor, ankle dorsiflexor and plantar flexor  Sensation intact to light touch and pinprick at bilateral L3, L4, L5-S1 dermatomal distribution  Skin: Skin is warm and dry.  Psychiatric: She has a normal mood and affect. Her behavior is normal. Judgment and thought content normal.  Nursing note and vitals reviewed.  Patient has bilateral hip flexion contractures of approximately 20 she does have pain with Maisie Fus test         Assessment & Plan:  1. Bilateral hip labral tears with osteoarthritis. In a patient with mild hip acetabular dysplasia, shallow acetabulum. We discussed that this is her primary pain diagnosis rather than her low back. We also discussed the hip contractures bilaterally  We discussed treatment options including physical therapy, home exercise program to include primarily low impact activities such as stationary bicycling. Some stretching activities to address hip flexor contracture. We also discussed  intra-articular cortical steroid injection to help with pain relief. We discussed orthopedic referral to Orthopedics for arthroscopic labral repair versus other interventions. She would like to pursue this option as well as continue and adjust home exercise program.

## 2015-11-28 ENCOUNTER — Telehealth: Payer: Self-pay | Admitting: Physical Medicine & Rehabilitation

## 2015-11-28 DIAGNOSIS — M25559 Pain in unspecified hip: Secondary | ICD-10-CM

## 2015-11-28 DIAGNOSIS — M162 Bilateral osteoarthritis resulting from hip dysplasia: Secondary | ICD-10-CM

## 2015-11-28 DIAGNOSIS — G8929 Other chronic pain: Secondary | ICD-10-CM

## 2015-11-28 NOTE — Telephone Encounter (Signed)
Patient called wanting to know if she could have an order for Physical [place] an [place] for treatment before meeting with [facility].

## 2015-11-29 NOTE — Telephone Encounter (Signed)
May order physical therapy with diagnosis of chronic hip pain, [place] [facility]

## 2015-11-29 NOTE — Telephone Encounter (Signed)
Order placed

## 2016-04-22 ENCOUNTER — Other Ambulatory Visit: Payer: Self-pay | Admitting: Orthopedic Surgery

## 2017-12-07 ENCOUNTER — Other Ambulatory Visit: Payer: Self-pay | Admitting: Physician Assistant

## 2017-12-07 DIAGNOSIS — Z1231 Encounter for screening mammogram for malignant neoplasm of breast: Secondary | ICD-10-CM

## 2018-01-20 ENCOUNTER — Ambulatory Visit
Admission: RE | Admit: 2018-01-20 | Discharge: 2018-01-20 | Disposition: A | Discharge status: Home / Self Care | Payer: BC Managed Care – PPO | Source: Ambulatory Visit | Attending: Physician Assistant | Admitting: Physician Assistant

## 2018-01-20 DIAGNOSIS — Z1231 Encounter for screening mammogram for malignant neoplasm of breast: Secondary | ICD-10-CM

## 2019-03-10 ENCOUNTER — Other Ambulatory Visit: Payer: Self-pay | Admitting: Physician Assistant

## 2019-03-10 DIAGNOSIS — Z1231 Encounter for screening mammogram for malignant neoplasm of breast: Secondary | ICD-10-CM

## 2019-03-14 ENCOUNTER — Other Ambulatory Visit: Payer: Self-pay

## 2019-03-14 ENCOUNTER — Ambulatory Visit
Admission: RE | Admit: 2019-03-14 | Discharge: 2019-03-14 | Disposition: A | Discharge status: Home / Self Care | Payer: BC Managed Care – PPO | Source: Ambulatory Visit

## 2019-03-14 DIAGNOSIS — Z1231 Encounter for screening mammogram for malignant neoplasm of breast: Secondary | ICD-10-CM

## 2020-08-08 ENCOUNTER — Other Ambulatory Visit: Payer: Self-pay | Admitting: Gastroenterology

## 2020-08-08 DIAGNOSIS — R1013 Epigastric pain: Secondary | ICD-10-CM

## 2020-08-08 DIAGNOSIS — R1310 Dysphagia, unspecified: Secondary | ICD-10-CM

## 2020-08-20 ENCOUNTER — Other Ambulatory Visit: Payer: BC Managed Care – PPO

## 2020-08-28 ENCOUNTER — Other Ambulatory Visit: Payer: BC Managed Care – PPO

## 2021-04-14 ENCOUNTER — Other Ambulatory Visit (HOSPITAL_COMMUNITY): Payer: Self-pay

## 2021-04-14 MED ORDER — AMPHETAMINE-DEXTROAMPHETAMINE 20 MG PO TABS
20.0000 mg | ORAL_TABLET | Freq: Two times a day (BID) | ORAL | 0 refills | Status: DC
  Filled 2021-05-22: qty 60, 30d supply, fill #0

## 2021-04-14 MED ORDER — AMPHETAMINE-DEXTROAMPHETAMINE 20 MG PO TABS
20.0000 mg | ORAL_TABLET | Freq: Two times a day (BID) | ORAL | 0 refills | Status: AC
  Filled 2021-04-14: qty 60, 30d supply, fill #0

## 2021-04-15 ENCOUNTER — Other Ambulatory Visit (HOSPITAL_COMMUNITY): Payer: Self-pay

## 2021-05-22 ENCOUNTER — Other Ambulatory Visit (HOSPITAL_COMMUNITY): Payer: Self-pay

## 2021-05-23 ENCOUNTER — Other Ambulatory Visit (HOSPITAL_COMMUNITY): Payer: Self-pay

## 2021-05-26 ENCOUNTER — Other Ambulatory Visit (HOSPITAL_COMMUNITY): Payer: Self-pay

## 2021-06-26 ENCOUNTER — Other Ambulatory Visit (HOSPITAL_COMMUNITY): Payer: Self-pay

## 2021-06-26 MED ORDER — AMPHETAMINE-DEXTROAMPHETAMINE 20 MG PO TABS
ORAL_TABLET | ORAL | 0 refills | Status: DC
  Filled 2021-06-26: qty 60, 30d supply, fill #0

## 2021-08-11 ENCOUNTER — Other Ambulatory Visit (HOSPITAL_COMMUNITY): Payer: Self-pay

## 2021-08-12 ENCOUNTER — Other Ambulatory Visit (HOSPITAL_COMMUNITY): Payer: Self-pay

## 2021-08-12 MED ORDER — AMPHETAMINE-DEXTROAMPHETAMINE 20 MG PO TABS
ORAL_TABLET | ORAL | 0 refills | Status: DC
  Filled 2021-08-12: qty 4, 2d supply, fill #0

## 2021-08-15 ENCOUNTER — Other Ambulatory Visit (HOSPITAL_COMMUNITY): Payer: Self-pay

## 2021-08-15 MED ORDER — AMPHETAMINE-DEXTROAMPHETAMINE 20 MG PO TABS
ORAL_TABLET | ORAL | 0 refills | Status: AC
  Filled 2021-08-15 – 2021-09-24 (×2): qty 60, 30d supply, fill #0

## 2021-08-15 MED ORDER — AMPHETAMINE-DEXTROAMPHETAMINE 20 MG PO TABS
20.0000 mg | ORAL_TABLET | Freq: Two times a day (BID) | ORAL | 0 refills | Status: DC
  Filled 2021-10-30: qty 60, 30d supply, fill #0

## 2021-08-15 MED ORDER — AMPHETAMINE-DEXTROAMPHETAMINE 20 MG PO TABS
ORAL_TABLET | ORAL | 0 refills | Status: AC
  Filled 2021-08-15: qty 60, 30d supply, fill #0

## 2021-09-18 IMAGING — MG DIGITAL SCREENING BILAT W/ TOMO W/ CAD
8 series · 8 of 24 positions shown · non-contrast
Comparison: Previous exam(s).

CLINICAL DATA: Screening.

EXAM:
DIGITAL SCREENING BILATERAL MAMMOGRAM WITH TOMO AND CAD

[R CC synth-2D]
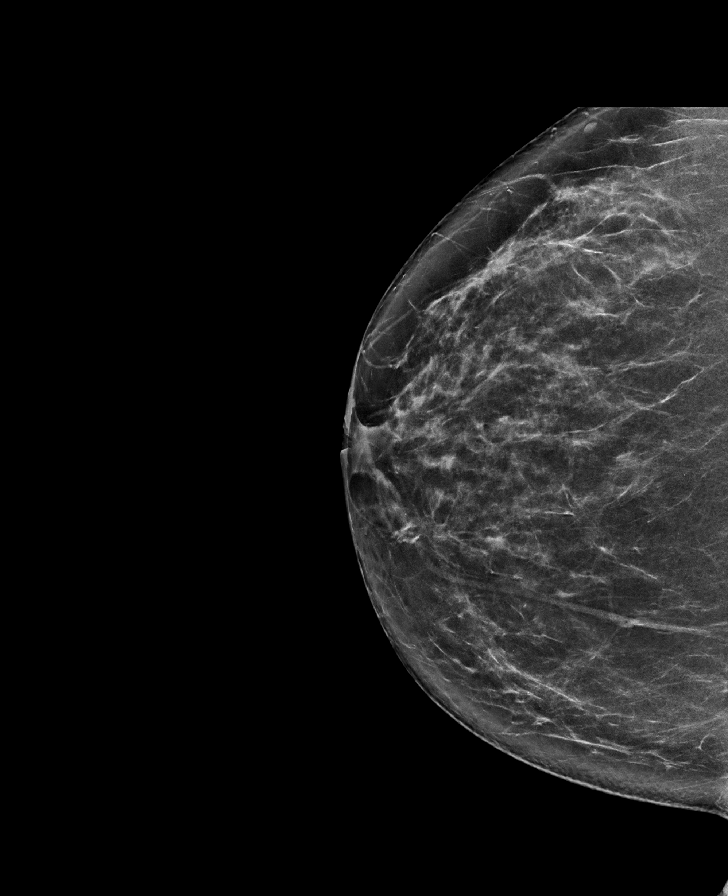

[L CC synth-2D]
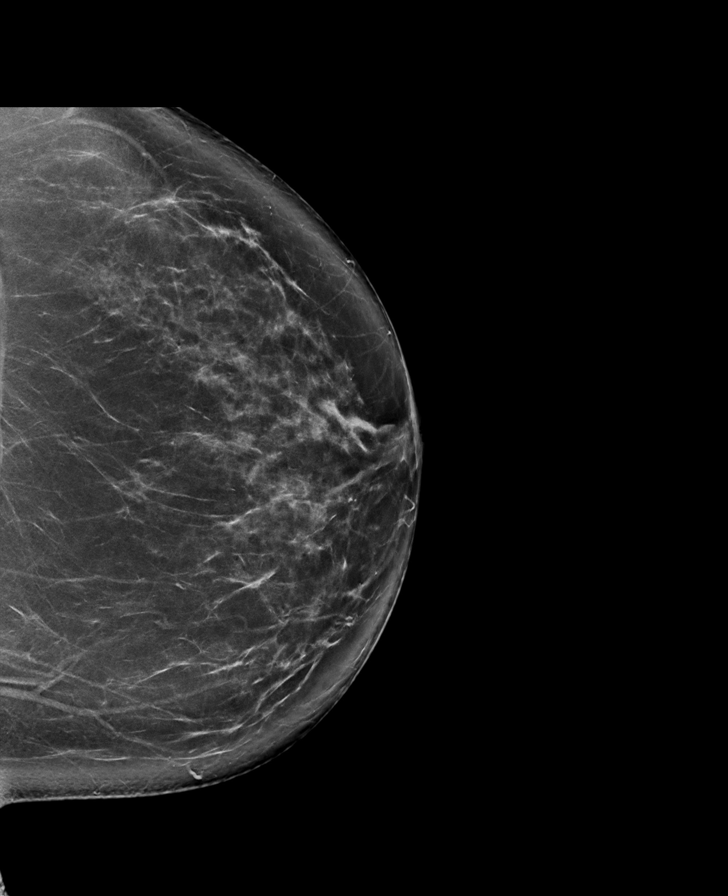

[R MLO synth-2D]
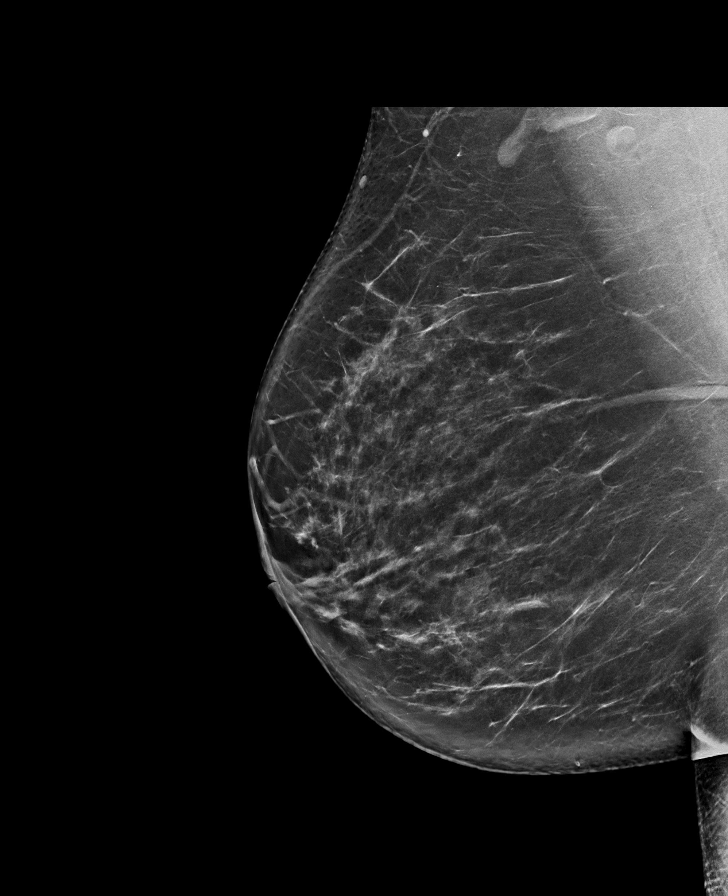

[L MLO synth-2D]
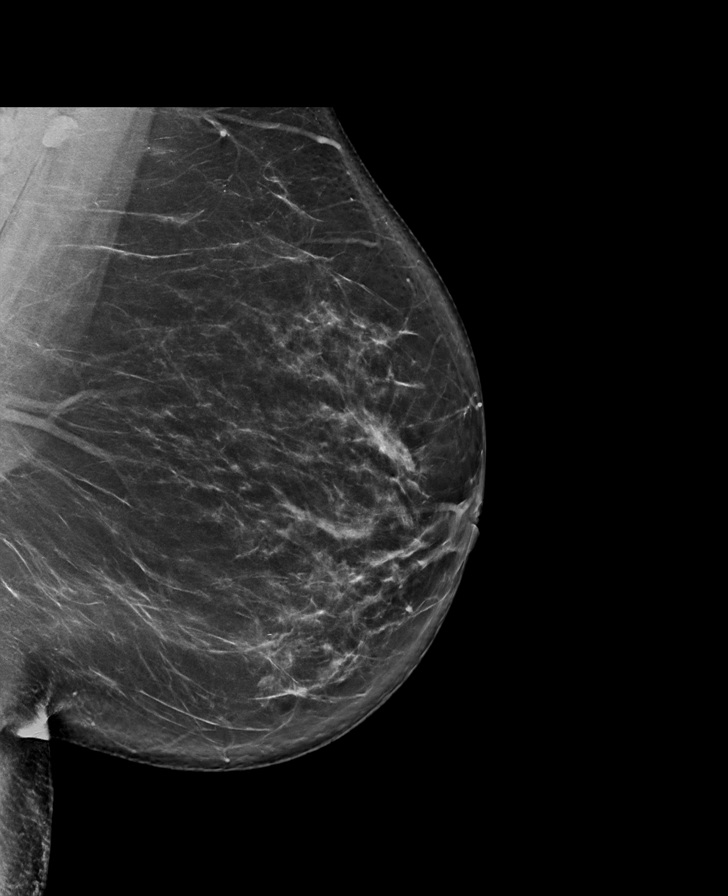

[L CC tomo · tomo slice 47/93.0]
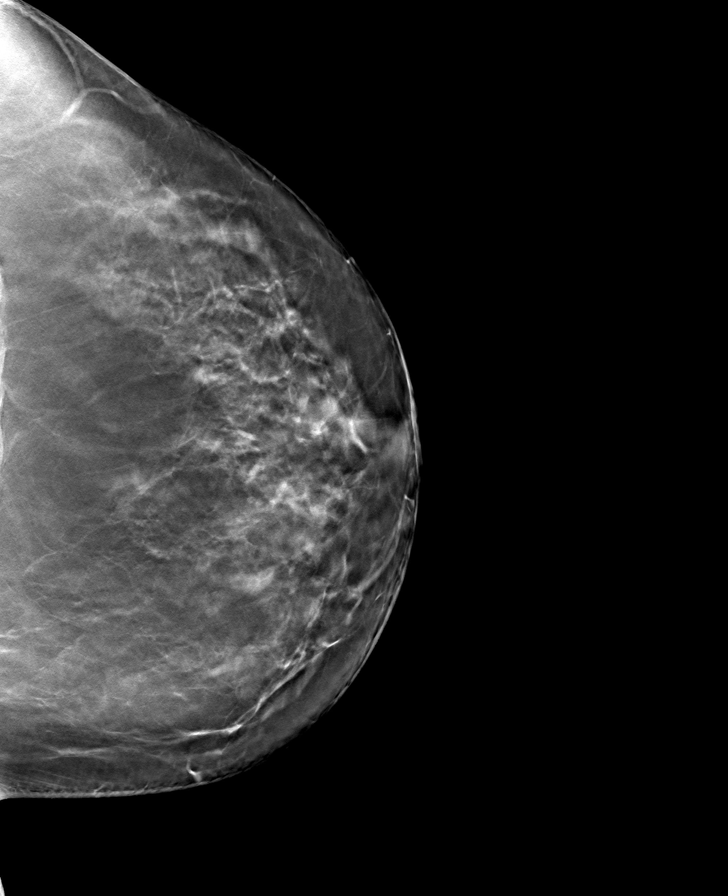

[L MLO tomo · tomo slice 48/95.0]
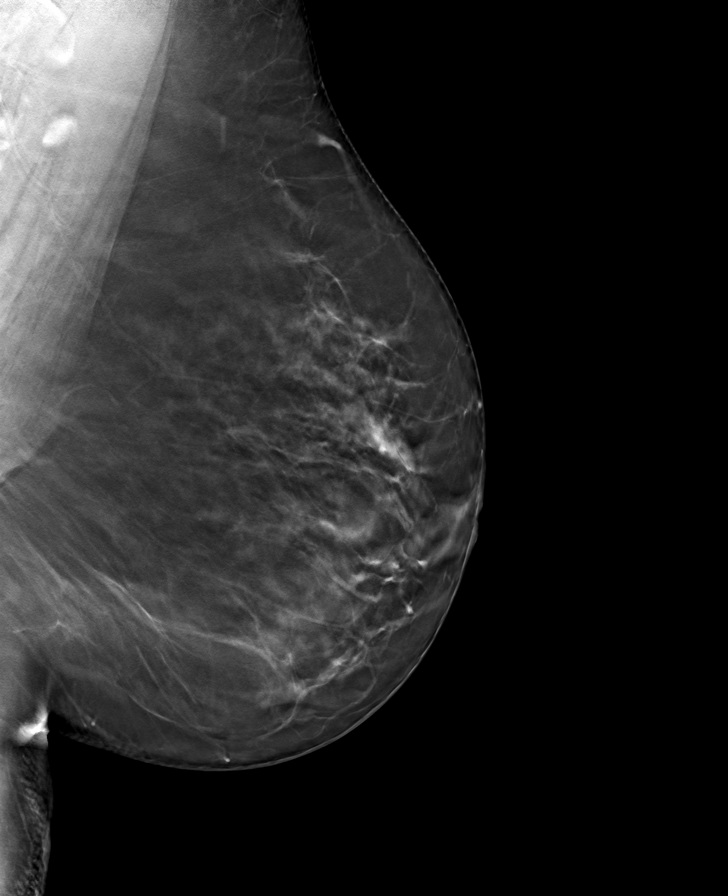

[R CC tomo · tomo slice 41/82.0]
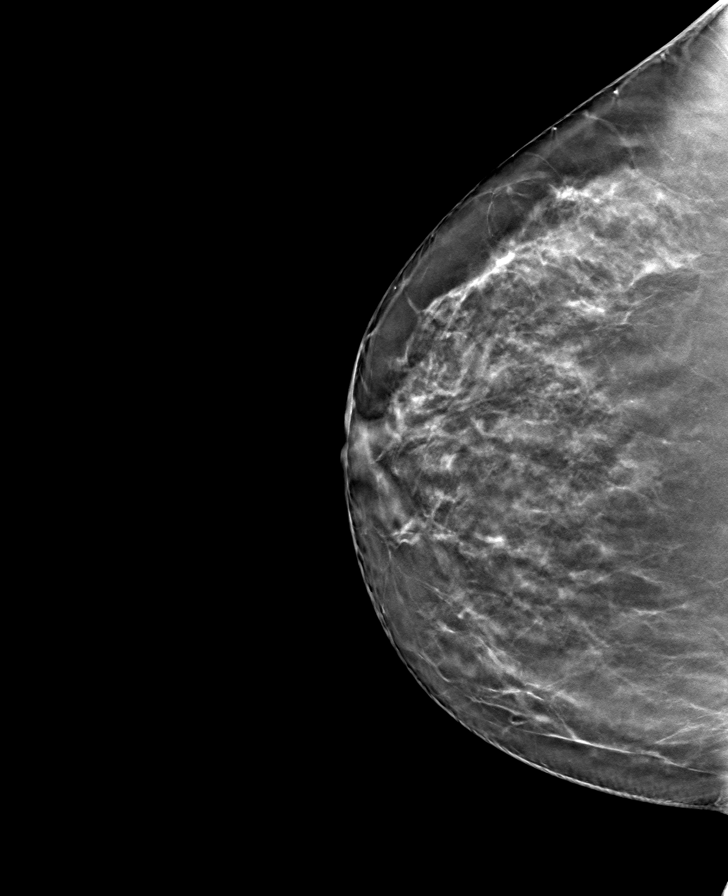

[R MLO tomo · tomo slice 47/94.0]
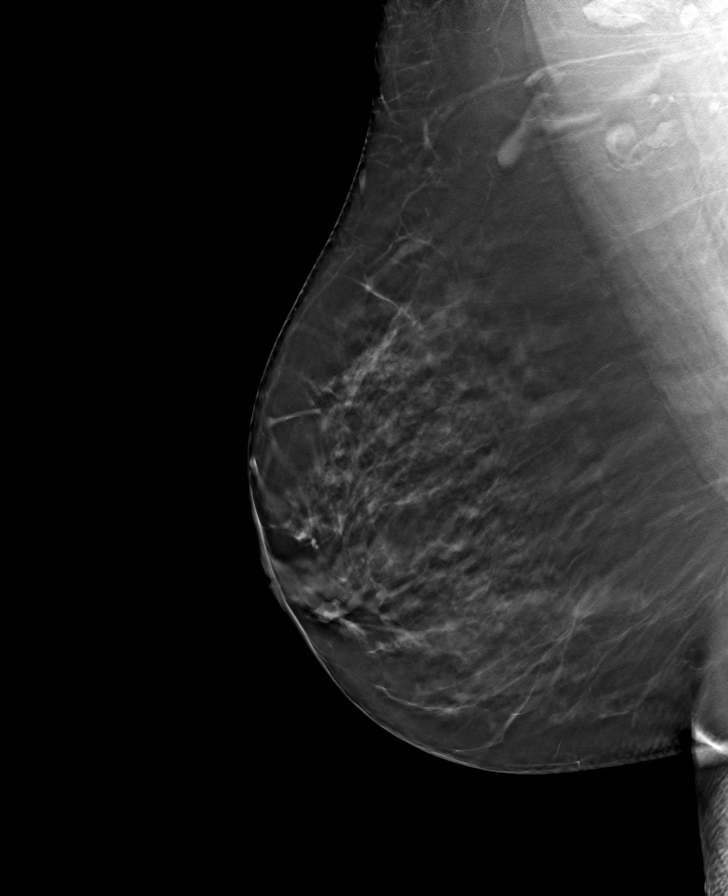

[8 of 24 positions shown; findings below may reference images not displayed]

ACR Breast Density Category b: There are scattered areas of
fibroglandular density.
FINDINGS: There are no findings suspicious for malignancy. Images were
processed with CAD.
IMPRESSION: No mammographic evidence of malignancy. A result letter of this
screening mammogram will be mailed directly to the patient.

RECOMMENDATION:
Screening mammogram in one year. (Code:[id number])

BI-RADS CATEGORY  1: Negative.

## 2021-09-24 ENCOUNTER — Other Ambulatory Visit (HOSPITAL_COMMUNITY): Payer: Self-pay

## 2021-10-28 ENCOUNTER — Other Ambulatory Visit (HOSPITAL_COMMUNITY): Payer: Self-pay

## 2021-10-28 MED ORDER — OZEMPIC (0.25 OR 0.5 MG/DOSE) 2 MG/3ML ~~LOC~~ SOPN
PEN_INJECTOR | SUBCUTANEOUS | 0 refills | Status: DC
  Filled 2021-10-28: qty 3, 28d supply, fill #0

## 2021-10-30 ENCOUNTER — Other Ambulatory Visit (HOSPITAL_COMMUNITY): Payer: Self-pay

## 2021-10-31 ENCOUNTER — Other Ambulatory Visit (HOSPITAL_COMMUNITY): Payer: Self-pay

## 2021-11-04 ENCOUNTER — Other Ambulatory Visit (HOSPITAL_COMMUNITY): Payer: Self-pay

## 2021-11-11 ENCOUNTER — Other Ambulatory Visit (HOSPITAL_COMMUNITY): Payer: Self-pay

## 2021-11-11 MED ORDER — WEGOVY 0.25 MG/0.5ML ~~LOC~~ SOAJ
0.2500 mg | SUBCUTANEOUS | 0 refills | Status: AC
  Filled 2021-11-11: qty 2, 30d supply, fill #0
  Filled 2022-03-26: qty 2, 28d supply, fill #0

## 2021-11-12 ENCOUNTER — Other Ambulatory Visit (HOSPITAL_COMMUNITY): Payer: Self-pay

## 2021-11-12 MED ORDER — SAXENDA 18 MG/3ML ~~LOC~~ SOPN
3.0000 mg | PEN_INJECTOR | SUBCUTANEOUS | 0 refills | Status: DC
  Filled 2021-11-12: qty 15, 38d supply, fill #0

## 2021-11-13 ENCOUNTER — Other Ambulatory Visit (HOSPITAL_COMMUNITY): Payer: Self-pay

## 2021-11-20 ENCOUNTER — Other Ambulatory Visit (HOSPITAL_COMMUNITY): Payer: Self-pay

## 2021-11-25 ENCOUNTER — Other Ambulatory Visit (HOSPITAL_COMMUNITY): Payer: Self-pay

## 2021-11-25 MED ORDER — DULOXETINE HCL 30 MG PO CPEP
30.0000 mg | ORAL_CAPSULE | Freq: Every day | ORAL | 1 refills | Status: AC
  Filled 2021-11-25 – 2021-12-29 (×2): qty 30, 30d supply, fill #0
  Filled 2022-05-04: qty 30, 30d supply, fill #1

## 2021-11-25 MED ORDER — DULOXETINE HCL 30 MG PO CPEP
30.0000 mg | ORAL_CAPSULE | Freq: Every day | ORAL | 1 refills | Status: DC
  Filled 2021-11-25: qty 30, 30d supply, fill #0
  Filled 2022-01-26: qty 30, 30d supply, fill #1

## 2021-11-28 ENCOUNTER — Other Ambulatory Visit (HOSPITAL_COMMUNITY): Payer: Self-pay

## 2021-12-09 ENCOUNTER — Other Ambulatory Visit (HOSPITAL_COMMUNITY): Payer: Self-pay

## 2021-12-09 MED ORDER — AMPHETAMINE-DEXTROAMPHETAMINE 20 MG PO TABS
20.0000 mg | ORAL_TABLET | Freq: Two times a day (BID) | ORAL | 0 refills | Status: DC
  Filled 2021-12-09: qty 60, 30d supply, fill #0

## 2021-12-12 ENCOUNTER — Other Ambulatory Visit (HOSPITAL_COMMUNITY): Payer: Self-pay

## 2021-12-23 ENCOUNTER — Other Ambulatory Visit (HOSPITAL_COMMUNITY): Payer: Self-pay

## 2021-12-23 MED ORDER — SAXENDA 18 MG/3ML ~~LOC~~ SOPN
3.0000 mg | PEN_INJECTOR | Freq: Every day | SUBCUTANEOUS | 0 refills | Status: DC
  Filled 2021-12-23: qty 15, 30d supply, fill #0

## 2021-12-24 ENCOUNTER — Other Ambulatory Visit (HOSPITAL_COMMUNITY): Payer: Self-pay

## 2021-12-29 ENCOUNTER — Other Ambulatory Visit (HOSPITAL_COMMUNITY): Payer: Self-pay

## 2021-12-30 ENCOUNTER — Other Ambulatory Visit (HOSPITAL_COMMUNITY): Payer: Self-pay

## 2022-01-15 ENCOUNTER — Other Ambulatory Visit (HOSPITAL_COMMUNITY): Payer: Self-pay

## 2022-01-15 MED ORDER — AMPHETAMINE-DEXTROAMPHETAMINE 20 MG PO TABS
20.0000 mg | ORAL_TABLET | Freq: Two times a day (BID) | ORAL | 0 refills | Status: DC
  Filled 2022-01-15: qty 60, 30d supply, fill #0

## 2022-01-26 ENCOUNTER — Other Ambulatory Visit (HOSPITAL_COMMUNITY): Payer: Self-pay

## 2022-01-26 MED ORDER — SAXENDA 18 MG/3ML ~~LOC~~ SOPN
PEN_INJECTOR | SUBCUTANEOUS | 0 refills | Status: DC
  Filled 2022-01-26: qty 15, 30d supply, fill #0

## 2022-02-24 ENCOUNTER — Other Ambulatory Visit (HOSPITAL_COMMUNITY): Payer: Self-pay

## 2022-02-24 MED ORDER — AMPHETAMINE-DEXTROAMPHETAMINE 20 MG PO TABS
20.0000 mg | ORAL_TABLET | Freq: Two times a day (BID) | ORAL | 0 refills | Status: DC
  Filled 2022-02-24: qty 60, 30d supply, fill #0

## 2022-02-24 MED ORDER — SAXENDA 18 MG/3ML ~~LOC~~ SOPN
3.0000 mg | PEN_INJECTOR | Freq: Every day | SUBCUTANEOUS | 0 refills | Status: DC
  Filled 2022-02-24: qty 15, 30d supply, fill #0

## 2022-02-25 ENCOUNTER — Other Ambulatory Visit (HOSPITAL_COMMUNITY): Payer: Self-pay

## 2022-02-26 ENCOUNTER — Other Ambulatory Visit (HOSPITAL_COMMUNITY): Payer: Self-pay

## 2022-02-26 MED ORDER — DULOXETINE HCL 30 MG PO CPEP
30.0000 mg | ORAL_CAPSULE | Freq: Every day | ORAL | 1 refills | Status: DC
  Filled 2022-02-26: qty 30, 30d supply, fill #0
  Filled 2022-03-26 (×2): qty 30, 30d supply, fill #1

## 2022-03-26 ENCOUNTER — Other Ambulatory Visit (HOSPITAL_COMMUNITY): Payer: Self-pay

## 2022-03-26 ENCOUNTER — Other Ambulatory Visit: Payer: Self-pay

## 2022-03-26 MED ORDER — SAXENDA 18 MG/3ML ~~LOC~~ SOPN
3.0000 mg | PEN_INJECTOR | Freq: Every day | SUBCUTANEOUS | 0 refills | Status: AC
  Filled 2022-03-26 (×2): qty 15, 30d supply, fill #0

## 2022-03-26 MED ORDER — AMPHETAMINE-DEXTROAMPHETAMINE 20 MG PO TABS
20.0000 mg | ORAL_TABLET | Freq: Two times a day (BID) | ORAL | 0 refills | Status: DC
  Filled 2022-03-26: qty 60, 30d supply, fill #0

## 2022-03-27 ENCOUNTER — Other Ambulatory Visit (HOSPITAL_COMMUNITY): Payer: Self-pay

## 2022-04-16 ENCOUNTER — Other Ambulatory Visit: Payer: Self-pay

## 2022-05-04 ENCOUNTER — Other Ambulatory Visit (HOSPITAL_COMMUNITY): Payer: Self-pay

## 2022-05-04 ENCOUNTER — Other Ambulatory Visit: Payer: Self-pay

## 2022-05-04 MED ORDER — AMPHETAMINE-DEXTROAMPHETAMINE 20 MG PO TABS
20.0000 mg | ORAL_TABLET | Freq: Two times a day (BID) | ORAL | 0 refills | Status: DC
  Filled 2022-05-04: qty 60, 30d supply, fill #0

## 2022-05-25 ENCOUNTER — Other Ambulatory Visit (HOSPITAL_COMMUNITY): Payer: Self-pay

## 2022-06-04 ENCOUNTER — Other Ambulatory Visit (HOSPITAL_COMMUNITY): Payer: Self-pay

## 2022-06-04 MED ORDER — PHENTERMINE HCL 37.5 MG PO TABS
18.7500 mg | ORAL_TABLET | Freq: Every day | ORAL | 0 refills | Status: DC
  Filled 2022-06-04: qty 30, 30d supply, fill #0

## 2022-06-05 ENCOUNTER — Other Ambulatory Visit (HOSPITAL_COMMUNITY): Payer: Self-pay

## 2022-06-08 ENCOUNTER — Other Ambulatory Visit (HOSPITAL_COMMUNITY): Payer: Self-pay

## 2022-06-08 MED ORDER — AMPHETAMINE-DEXTROAMPHETAMINE 20 MG PO TABS
20.0000 mg | ORAL_TABLET | Freq: Two times a day (BID) | ORAL | 0 refills | Status: DC
  Filled 2022-06-08: qty 60, 30d supply, fill #0

## 2022-06-10 ENCOUNTER — Other Ambulatory Visit (HOSPITAL_COMMUNITY): Payer: Self-pay

## 2022-06-10 MED ORDER — DULOXETINE HCL 30 MG PO CPEP
30.0000 mg | ORAL_CAPSULE | Freq: Every day | ORAL | 1 refills | Status: DC
  Filled 2022-06-10: qty 30, 30d supply, fill #0
  Filled 2022-07-16: qty 30, 30d supply, fill #1

## 2022-06-16 ENCOUNTER — Other Ambulatory Visit (HOSPITAL_COMMUNITY): Payer: Self-pay

## 2022-06-16 MED ORDER — CLOBETASOL PROPIONATE 0.05 % EX SOLN
CUTANEOUS | 4 refills | Status: AC
  Filled 2022-06-16: qty 50, 30d supply, fill #0
  Filled 2022-08-27 (×2): qty 50, 30d supply, fill #1
  Filled 2022-10-06: qty 50, 30d supply, fill #2
  Filled 2023-01-08: qty 50, 30d supply, fill #3
  Filled 2023-06-07: qty 50, 30d supply, fill #4

## 2022-06-17 ENCOUNTER — Other Ambulatory Visit (HOSPITAL_COMMUNITY): Payer: Self-pay

## 2022-07-02 ENCOUNTER — Other Ambulatory Visit (HOSPITAL_COMMUNITY): Payer: Self-pay

## 2022-07-07 ENCOUNTER — Other Ambulatory Visit (HOSPITAL_COMMUNITY): Payer: Self-pay

## 2022-07-07 MED ORDER — PHENTERMINE HCL 37.5 MG PO TABS
18.7500 mg | ORAL_TABLET | Freq: Every day | ORAL | 0 refills | Status: DC
  Filled 2022-07-07: qty 30, 30d supply, fill #0

## 2022-07-16 ENCOUNTER — Other Ambulatory Visit: Payer: Self-pay

## 2022-07-16 ENCOUNTER — Other Ambulatory Visit (HOSPITAL_COMMUNITY): Payer: Self-pay

## 2022-07-16 MED ORDER — AMPHETAMINE-DEXTROAMPHETAMINE 20 MG PO TABS
20.0000 mg | ORAL_TABLET | Freq: Two times a day (BID) | ORAL | 0 refills | Status: DC
  Filled 2022-07-16: qty 60, 30d supply, fill #0

## 2022-08-13 ENCOUNTER — Other Ambulatory Visit (HOSPITAL_COMMUNITY): Payer: Self-pay

## 2022-08-13 MED ORDER — PHENTERMINE HCL 37.5 MG PO TABS
ORAL_TABLET | ORAL | 0 refills | Status: DC
  Filled 2022-08-13: qty 30, 30d supply, fill #0

## 2022-08-26 ENCOUNTER — Other Ambulatory Visit (HOSPITAL_COMMUNITY): Payer: Self-pay

## 2022-08-26 MED ORDER — TOPIRAMATE 25 MG PO TABS
25.0000 mg | ORAL_TABLET | Freq: Every day | ORAL | 0 refills | Status: AC
  Filled 2022-08-26: qty 30, 30d supply, fill #0

## 2022-08-26 MED ORDER — PHENTERMINE HCL 37.5 MG PO TABS
18.7500 mg | ORAL_TABLET | Freq: Every day | ORAL | 0 refills | Status: AC
  Filled 2022-08-26: qty 30, 30d supply, fill #0

## 2022-08-27 ENCOUNTER — Other Ambulatory Visit (HOSPITAL_COMMUNITY): Payer: Self-pay

## 2022-08-27 MED ORDER — AMPHETAMINE-DEXTROAMPHETAMINE 20 MG PO TABS
20.0000 mg | ORAL_TABLET | Freq: Two times a day (BID) | ORAL | 0 refills | Status: DC
  Filled 2022-08-27: qty 60, 30d supply, fill #0

## 2022-08-28 ENCOUNTER — Other Ambulatory Visit (HOSPITAL_COMMUNITY): Payer: Self-pay

## 2022-08-29 ENCOUNTER — Other Ambulatory Visit (HOSPITAL_COMMUNITY): Payer: Self-pay

## 2022-10-06 ENCOUNTER — Other Ambulatory Visit (HOSPITAL_COMMUNITY): Payer: Self-pay

## 2022-10-06 ENCOUNTER — Other Ambulatory Visit: Payer: Self-pay

## 2022-10-06 MED ORDER — AMPHETAMINE-DEXTROAMPHETAMINE 20 MG PO TABS
20.0000 mg | ORAL_TABLET | Freq: Two times a day (BID) | ORAL | 0 refills | Status: DC
  Filled 2022-10-06: qty 60, 30d supply, fill #0

## 2022-10-07 ENCOUNTER — Other Ambulatory Visit (HOSPITAL_COMMUNITY): Payer: Self-pay

## 2022-11-09 ENCOUNTER — Other Ambulatory Visit (HOSPITAL_COMMUNITY): Payer: Self-pay

## 2022-11-09 MED ORDER — AMPHETAMINE-DEXTROAMPHETAMINE 20 MG PO TABS
20.0000 mg | ORAL_TABLET | Freq: Two times a day (BID) | ORAL | 0 refills | Status: DC
  Filled 2022-11-09: qty 60, 30d supply, fill #0

## 2022-11-10 ENCOUNTER — Other Ambulatory Visit (HOSPITAL_COMMUNITY): Payer: Self-pay

## 2022-11-12 ENCOUNTER — Other Ambulatory Visit (HOSPITAL_COMMUNITY): Payer: Self-pay

## 2022-11-12 MED ORDER — LIRAGLUTIDE 18 MG/3ML ~~LOC~~ SOPN: PEN_INJECTOR | SUBCUTANEOUS | 0 refills | Status: DC

## 2022-11-13 ENCOUNTER — Other Ambulatory Visit (HOSPITAL_COMMUNITY): Payer: Self-pay

## 2022-11-26 ENCOUNTER — Other Ambulatory Visit (HOSPITAL_COMMUNITY): Payer: Self-pay

## 2022-11-27 ENCOUNTER — Other Ambulatory Visit (HOSPITAL_COMMUNITY): Payer: Self-pay

## 2022-11-27 MED ORDER — LIRAGLUTIDE 18 MG/3ML ~~LOC~~ SOPN
0.6000 mg | PEN_INJECTOR | Freq: Every day | SUBCUTANEOUS | 0 refills | Status: AC
Start: 2022-11-26 — End: ?
  Filled 2022-11-27 – 2022-12-07 (×2): qty 3, 30d supply, fill #0

## 2022-12-01 ENCOUNTER — Other Ambulatory Visit (HOSPITAL_COMMUNITY): Payer: Self-pay

## 2022-12-01 MED ORDER — LIRAGLUTIDE 18 MG/3ML ~~LOC~~ SOPN
3.0000 mg | PEN_INJECTOR | Freq: Every day | SUBCUTANEOUS | 0 refills | Status: DC
  Filled 2022-12-01: qty 9, 18d supply, fill #0

## 2022-12-03 ENCOUNTER — Other Ambulatory Visit (HOSPITAL_COMMUNITY): Payer: Self-pay

## 2022-12-03 ENCOUNTER — Other Ambulatory Visit: Payer: Self-pay

## 2022-12-03 ENCOUNTER — Encounter (HOSPITAL_COMMUNITY): Payer: Self-pay

## 2022-12-03 MED ORDER — DULOXETINE HCL 30 MG PO CPEP
30.0000 mg | ORAL_CAPSULE | Freq: Every day | ORAL | 1 refills | Status: AC
  Filled 2022-12-03: qty 30, 30d supply, fill #0
  Filled 2023-01-08: qty 30, 30d supply, fill #1

## 2022-12-04 ENCOUNTER — Other Ambulatory Visit (HOSPITAL_COMMUNITY): Payer: Self-pay

## 2022-12-07 ENCOUNTER — Other Ambulatory Visit (HOSPITAL_COMMUNITY): Payer: Self-pay

## 2022-12-08 ENCOUNTER — Other Ambulatory Visit (HOSPITAL_COMMUNITY): Payer: Self-pay

## 2022-12-14 ENCOUNTER — Other Ambulatory Visit (HOSPITAL_COMMUNITY): Payer: Self-pay

## 2022-12-14 MED ORDER — AMPHETAMINE-DEXTROAMPHETAMINE 20 MG PO TABS
20.0000 mg | ORAL_TABLET | Freq: Two times a day (BID) | ORAL | 0 refills | Status: DC
Start: 2022-12-14 — End: 2023-01-18
  Filled 2022-12-14: qty 60, 30d supply, fill #0

## 2022-12-15 ENCOUNTER — Other Ambulatory Visit (HOSPITAL_COMMUNITY): Payer: Self-pay

## 2023-01-05 ENCOUNTER — Other Ambulatory Visit (HOSPITAL_COMMUNITY): Payer: Self-pay

## 2023-01-08 ENCOUNTER — Other Ambulatory Visit: Payer: Self-pay

## 2023-01-08 ENCOUNTER — Other Ambulatory Visit (HOSPITAL_COMMUNITY): Payer: Self-pay

## 2023-01-09 ENCOUNTER — Other Ambulatory Visit (HOSPITAL_COMMUNITY): Payer: Self-pay

## 2023-01-18 ENCOUNTER — Other Ambulatory Visit (HOSPITAL_COMMUNITY): Payer: Self-pay

## 2023-01-18 MED ORDER — AMPHETAMINE-DEXTROAMPHETAMINE 20 MG PO TABS
20.0000 mg | ORAL_TABLET | Freq: Two times a day (BID) | ORAL | 0 refills | Status: DC
Start: 2023-01-18 — End: 2023-02-25
  Filled 2023-01-18: qty 60, 30d supply, fill #0

## 2023-01-19 ENCOUNTER — Other Ambulatory Visit (HOSPITAL_COMMUNITY): Payer: Self-pay

## 2023-02-03 ENCOUNTER — Other Ambulatory Visit (HOSPITAL_COMMUNITY): Payer: Self-pay

## 2023-02-05 ENCOUNTER — Other Ambulatory Visit (HOSPITAL_COMMUNITY): Payer: Self-pay

## 2023-02-24 ENCOUNTER — Other Ambulatory Visit (HOSPITAL_COMMUNITY): Payer: Self-pay

## 2023-02-25 ENCOUNTER — Other Ambulatory Visit (HOSPITAL_BASED_OUTPATIENT_CLINIC_OR_DEPARTMENT_OTHER): Payer: Self-pay

## 2023-02-25 ENCOUNTER — Other Ambulatory Visit (HOSPITAL_COMMUNITY): Payer: Self-pay

## 2023-02-25 MED ORDER — AMPHETAMINE-DEXTROAMPHETAMINE 20 MG PO TABS
20.0000 mg | ORAL_TABLET | Freq: Two times a day (BID) | ORAL | 0 refills | Status: DC
  Filled 2023-02-25: qty 60, 30d supply, fill #0

## 2023-02-26 ENCOUNTER — Other Ambulatory Visit (HOSPITAL_COMMUNITY): Payer: Self-pay

## 2023-03-15 ENCOUNTER — Other Ambulatory Visit: Payer: Self-pay | Admitting: Family Medicine

## 2023-03-15 ENCOUNTER — Other Ambulatory Visit (HOSPITAL_BASED_OUTPATIENT_CLINIC_OR_DEPARTMENT_OTHER): Payer: Self-pay | Admitting: Family Medicine

## 2023-03-15 DIAGNOSIS — Z1231 Encounter for screening mammogram for malignant neoplasm of breast: Secondary | ICD-10-CM

## 2023-03-15 DIAGNOSIS — R000 Tachycardia, unspecified: Secondary | ICD-10-CM

## 2023-03-23 ENCOUNTER — Ambulatory Visit: Payer: Self-pay | Admitting: Podiatry

## 2023-03-25 ENCOUNTER — Ambulatory Visit
Admission: RE | Admit: 2023-03-25 | Discharge: 2023-03-25 | Disposition: A | Discharge status: Home / Self Care | Payer: 59 | Source: Ambulatory Visit | Attending: Family Medicine | Admitting: Family Medicine

## 2023-03-25 DIAGNOSIS — Z1231 Encounter for screening mammogram for malignant neoplasm of breast: Secondary | ICD-10-CM

## 2023-03-29 ENCOUNTER — Other Ambulatory Visit (HOSPITAL_COMMUNITY): Payer: Self-pay

## 2023-03-29 MED ORDER — AMPHETAMINE-DEXTROAMPHETAMINE 20 MG PO TABS
20.0000 mg | ORAL_TABLET | Freq: Two times a day (BID) | ORAL | 0 refills | Status: DC
Start: 2023-03-29 — End: 2023-04-27
  Filled 2023-03-29: qty 60, 30d supply, fill #0

## 2023-04-09 ENCOUNTER — Ambulatory Visit (HOSPITAL_COMMUNITY)
Admission: RE | Admit: 2023-04-09 | Discharge: 2023-04-09 | Disposition: A | Discharge status: Home / Self Care | Payer: Self-pay | Source: Ambulatory Visit | Attending: Family Medicine | Admitting: Family Medicine

## 2023-04-09 DIAGNOSIS — R000 Tachycardia, unspecified: Secondary | ICD-10-CM | POA: Insufficient documentation

## 2023-04-27 ENCOUNTER — Other Ambulatory Visit (HOSPITAL_COMMUNITY): Payer: Self-pay

## 2023-04-27 ENCOUNTER — Other Ambulatory Visit: Payer: Self-pay

## 2023-04-27 MED ORDER — VALSARTAN 80 MG PO TABS
80.0000 mg | ORAL_TABLET | Freq: Every day | ORAL | 0 refills | Status: DC
  Filled 2023-04-27: qty 90, 90d supply, fill #0

## 2023-04-27 MED ORDER — AMPHETAMINE-DEXTROAMPHETAMINE 20 MG PO TABS
20.0000 mg | ORAL_TABLET | Freq: Two times a day (BID) | ORAL | 0 refills | Status: DC
  Filled 2023-04-27: qty 60, 30d supply, fill #0

## 2023-04-28 ENCOUNTER — Other Ambulatory Visit (HOSPITAL_COMMUNITY): Payer: Self-pay

## 2023-05-19 ENCOUNTER — Other Ambulatory Visit (HOSPITAL_COMMUNITY): Payer: Self-pay

## 2023-05-19 MED ORDER — VALSARTAN 160 MG PO TABS
160.0000 mg | ORAL_TABLET | Freq: Every day | ORAL | 0 refills | Status: DC
  Filled 2023-05-19 – 2023-06-07 (×2): qty 90, 90d supply, fill #0

## 2023-05-19 MED ORDER — AMPHETAMINE-DEXTROAMPHETAMINE 20 MG PO TABS
20.0000 mg | ORAL_TABLET | Freq: Two times a day (BID) | ORAL | 0 refills | Status: AC
  Filled 2023-05-19: qty 145, 90d supply, fill #0
  Filled 2023-06-07: qty 135, 90d supply, fill #0

## 2023-05-23 ENCOUNTER — Other Ambulatory Visit (HOSPITAL_COMMUNITY): Payer: Self-pay

## 2023-05-25 ENCOUNTER — Encounter (HOSPITAL_COMMUNITY): Payer: Self-pay

## 2023-05-25 ENCOUNTER — Other Ambulatory Visit (HOSPITAL_COMMUNITY): Payer: Self-pay

## 2023-05-26 ENCOUNTER — Other Ambulatory Visit (HOSPITAL_COMMUNITY): Payer: Self-pay

## 2023-05-31 ENCOUNTER — Other Ambulatory Visit (HOSPITAL_COMMUNITY): Payer: Self-pay

## 2023-06-07 ENCOUNTER — Other Ambulatory Visit: Payer: Self-pay

## 2023-06-07 ENCOUNTER — Other Ambulatory Visit (HOSPITAL_COMMUNITY): Payer: Self-pay

## 2023-06-21 ENCOUNTER — Other Ambulatory Visit (HOSPITAL_COMMUNITY): Payer: Self-pay

## 2023-06-21 MED ORDER — WEGOVY 0.5 MG/0.5ML ~~LOC~~ SOAJ
0.5000 mg | SUBCUTANEOUS | 0 refills | Status: DC
Start: 2023-06-21 — End: 2023-07-19
  Filled 2023-06-21: qty 2, 28d supply, fill #0

## 2023-06-23 ENCOUNTER — Other Ambulatory Visit (HOSPITAL_COMMUNITY): Payer: Self-pay

## 2023-06-28 ENCOUNTER — Other Ambulatory Visit (HOSPITAL_COMMUNITY): Payer: Self-pay

## 2023-07-19 ENCOUNTER — Other Ambulatory Visit (HOSPITAL_BASED_OUTPATIENT_CLINIC_OR_DEPARTMENT_OTHER): Payer: Self-pay

## 2023-07-19 ENCOUNTER — Other Ambulatory Visit (HOSPITAL_COMMUNITY): Payer: Self-pay

## 2023-07-19 MED ORDER — WEGOVY 1 MG/0.5ML ~~LOC~~ SOAJ
1.0000 mg | SUBCUTANEOUS | 0 refills | Status: AC
  Filled 2023-07-19 – 2023-07-20 (×3): qty 2, 28d supply, fill #0

## 2023-07-20 ENCOUNTER — Other Ambulatory Visit (HOSPITAL_COMMUNITY): Payer: Self-pay

## 2023-08-10 ENCOUNTER — Other Ambulatory Visit (HOSPITAL_COMMUNITY): Payer: Self-pay

## 2023-08-10 MED ORDER — HYDROCHLOROTHIAZIDE 25 MG PO TABS
25.0000 mg | ORAL_TABLET | Freq: Every morning | ORAL | 3 refills | Status: AC
  Filled 2023-08-10: qty 90, 90d supply, fill #0

## 2023-08-24 ENCOUNTER — Other Ambulatory Visit (HOSPITAL_COMMUNITY): Payer: Self-pay

## 2023-09-02 ENCOUNTER — Other Ambulatory Visit (HOSPITAL_COMMUNITY): Payer: Self-pay

## 2023-09-02 MED ORDER — AMPHETAMINE-DEXTROAMPHETAMINE 20 MG PO TABS
30.0000 mg | ORAL_TABLET | Freq: Every day | ORAL | 0 refills | Status: AC
  Filled 2023-09-02: qty 145, 96d supply, fill #0
  Filled 2023-09-07: qty 135, 90d supply, fill #0

## 2023-09-06 ENCOUNTER — Other Ambulatory Visit (HOSPITAL_COMMUNITY): Payer: Self-pay

## 2023-09-07 ENCOUNTER — Other Ambulatory Visit (HOSPITAL_COMMUNITY): Payer: Self-pay

## 2023-09-13 ENCOUNTER — Other Ambulatory Visit (HOSPITAL_COMMUNITY): Payer: Self-pay

## 2023-09-13 ENCOUNTER — Other Ambulatory Visit: Payer: Self-pay

## 2023-09-13 MED ORDER — ZEPBOUND 5 MG/0.5ML ~~LOC~~ SOAJ
5.0000 mg | SUBCUTANEOUS | 1 refills | Status: AC
  Filled 2023-09-13: qty 2, 28d supply, fill #0

## 2023-09-14 ENCOUNTER — Other Ambulatory Visit (HOSPITAL_COMMUNITY): Payer: Self-pay

## 2023-10-26 ENCOUNTER — Other Ambulatory Visit (HOSPITAL_COMMUNITY): Payer: Self-pay

## 2023-10-26 MED ORDER — HYDROCHLOROTHIAZIDE 25 MG PO TABS
25.0000 mg | ORAL_TABLET | Freq: Every morning | ORAL | 1 refills | Status: DC
  Filled 2023-10-26: qty 90, 90d supply, fill #0

## 2023-11-25 ENCOUNTER — Other Ambulatory Visit (HOSPITAL_COMMUNITY): Payer: Self-pay

## 2023-11-25 MED ORDER — LISINOPRIL 20 MG PO TABS
20.0000 mg | ORAL_TABLET | Freq: Every day | ORAL | 3 refills | Status: DC
  Filled 2023-11-25: qty 90, 90d supply, fill #0

## 2023-11-25 MED ORDER — AMPHETAMINE-DEXTROAMPHETAMINE 20 MG PO TABS
ORAL_TABLET | ORAL | 0 refills | Status: AC
  Filled 2023-11-25: qty 180, 180d supply, fill #0
  Filled 2023-11-29 – 2023-11-30 (×2): qty 180, 90d supply, fill #0

## 2023-11-29 ENCOUNTER — Other Ambulatory Visit (HOSPITAL_COMMUNITY): Payer: Self-pay

## 2023-11-30 ENCOUNTER — Other Ambulatory Visit (HOSPITAL_COMMUNITY): Payer: Self-pay

## 2023-12-01 ENCOUNTER — Other Ambulatory Visit (HOSPITAL_COMMUNITY): Payer: Self-pay

## 2023-12-16 ENCOUNTER — Other Ambulatory Visit (HOSPITAL_COMMUNITY): Payer: Self-pay

## 2023-12-28 ENCOUNTER — Other Ambulatory Visit (HOSPITAL_COMMUNITY): Payer: Self-pay

## 2023-12-28 MED ORDER — CLOBETASOL PROPIONATE 0.05 % EX SOLN
Freq: Two times a day (BID) | CUTANEOUS | 0 refills | Status: AC
  Filled 2023-12-28: qty 50, 10d supply, fill #0

## 2023-12-28 MED ORDER — HYDROCHLOROTHIAZIDE 12.5 MG PO TABS
12.5000 mg | ORAL_TABLET | Freq: Every morning | ORAL | 0 refills | Status: DC
  Filled 2023-12-28: qty 90, 90d supply, fill #0

## 2024-03-07 ENCOUNTER — Other Ambulatory Visit (HOSPITAL_COMMUNITY): Payer: Self-pay

## 2024-03-07 MED ORDER — AMPHETAMINE-DEXTROAMPHETAMINE 20 MG PO TABS
20.0000 mg | ORAL_TABLET | Freq: Two times a day (BID) | ORAL | 0 refills | Status: AC
  Filled 2024-03-07: qty 180, 90d supply, fill #0

## 2024-03-07 MED ORDER — HYDROCHLOROTHIAZIDE 12.5 MG PO TABS
12.5000 mg | ORAL_TABLET | Freq: Every morning | ORAL | 3 refills | Status: AC
  Filled 2024-03-07: qty 90, 90d supply, fill #0
